# Patient Record
Sex: Female | Born: 1985
Health system: Southern US, Community
[De-identification: ages and names within clinical notes are randomized; demographics above are authoritative.]

## PROBLEM LIST (undated history)

## (undated) DIAGNOSIS — F329 Major depressive disorder, single episode, unspecified: Secondary | ICD-10-CM

## (undated) DIAGNOSIS — F79 Unspecified intellectual disabilities: Secondary | ICD-10-CM

## (undated) DIAGNOSIS — J40 Bronchitis, not specified as acute or chronic: Secondary | ICD-10-CM

## (undated) DIAGNOSIS — F32A Depression, unspecified: Secondary | ICD-10-CM

## (undated) DIAGNOSIS — F419 Anxiety disorder, unspecified: Secondary | ICD-10-CM

---

## 2007-04-09 ENCOUNTER — Ambulatory Visit (HOSPITAL_COMMUNITY): Admission: RE | Admit: 2007-04-09 | Discharge: 2007-04-09 | Payer: Self-pay | Admitting: Obstetrics & Gynecology

## 2007-04-12 ENCOUNTER — Ambulatory Visit (HOSPITAL_COMMUNITY): Admission: RE | Admit: 2007-04-12 | Discharge: 2007-04-12 | Payer: Self-pay | Admitting: Obstetrics & Gynecology

## 2007-05-07 ENCOUNTER — Ambulatory Visit: Payer: Self-pay | Admitting: Pediatrics

## 2007-05-27 ENCOUNTER — Inpatient Hospital Stay (HOSPITAL_COMMUNITY): Admission: AD | Admit: 2007-05-27 | Discharge: 2007-05-27 | Payer: Self-pay | Admitting: Obstetrics

## 2007-08-03 ENCOUNTER — Inpatient Hospital Stay (HOSPITAL_COMMUNITY): Admission: AD | Admit: 2007-08-03 | Discharge: 2007-08-03 | Payer: Self-pay | Admitting: Obstetrics & Gynecology

## 2007-08-06 ENCOUNTER — Inpatient Hospital Stay (HOSPITAL_COMMUNITY): Admission: AD | Admit: 2007-08-06 | Discharge: 2007-08-08 | Payer: Self-pay | Admitting: Obstetrics

## 2010-02-20 ENCOUNTER — Emergency Department (HOSPITAL_BASED_OUTPATIENT_CLINIC_OR_DEPARTMENT_OTHER): Admission: EM | Admit: 2010-02-20 | Discharge: 2010-02-20 | Payer: Self-pay | Admitting: Emergency Medicine

## 2010-02-20 ENCOUNTER — Ambulatory Visit: Payer: Self-pay | Admitting: Diagnostic Radiology

## 2010-09-18 ENCOUNTER — Encounter: Payer: Self-pay | Admitting: Obstetrics & Gynecology
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
## 2011-04-20 ENCOUNTER — Emergency Department (HOSPITAL_BASED_OUTPATIENT_CLINIC_OR_DEPARTMENT_OTHER)
Admission: EM | Admit: 2011-04-20 | Discharge: 2011-04-20 | Disposition: A | Discharge status: Home / Self Care | Payer: Medicaid Other | Attending: Emergency Medicine | Admitting: Emergency Medicine

## 2011-04-20 ENCOUNTER — Encounter: Payer: Self-pay | Admitting: *Deleted

## 2011-04-20 DIAGNOSIS — N63 Unspecified lump in unspecified breast: Secondary | ICD-10-CM | POA: Insufficient documentation

## 2011-04-20 DIAGNOSIS — R079 Chest pain, unspecified: Secondary | ICD-10-CM | POA: Insufficient documentation

## 2011-04-20 DIAGNOSIS — F341 Dysthymic disorder: Secondary | ICD-10-CM | POA: Insufficient documentation

## 2011-04-20 DIAGNOSIS — N644 Mastodynia: Secondary | ICD-10-CM

## 2011-04-20 HISTORY — DX: Unspecified intellectual disabilities: F79

## 2011-04-20 HISTORY — DX: Major depressive disorder, single episode, unspecified: F32.9

## 2011-04-20 HISTORY — DX: Anxiety disorder, unspecified: F41.9

## 2011-04-20 HISTORY — DX: Depression, unspecified: F32.A

## 2011-04-20 MED ORDER — HYDROCODONE-ACETAMINOPHEN 5-325 MG PO TABS
1.0000 | ORAL_TABLET | Freq: Once | ORAL | Status: AC
  Administered 2011-04-20: 1 via ORAL
  Filled 2011-04-20: qty 1

## 2011-04-20 MED ORDER — HYDROCODONE-ACETAMINOPHEN 5-500 MG PO TABS: 1.0000 | ORAL_TABLET | Freq: Four times a day (QID) | ORAL | Status: AC | PRN

## 2011-04-20 NOTE — ED Provider Notes (Signed)
History     CSN: [id number] Arrival date & time: 04/20/2011  1:09 PM  Chief Complaint  Patient presents with  . Chest Pain    left breast   HPI Comments: Pain in left breast for several days and getting worse.  She denies injury or trauma.  There is no redness or discharge.  No fevers or chills.  Worse with palpation.  No alleviating factors.  The history is provided by the patient.    Past Medical History  Diagnosis Date  . Depression   . Anxiety   . Mental retardation     History reviewed. No pertinent past surgical history.  No family history on file.  History  Substance Use Topics  . Smoking status: Former Games developer  . Smokeless tobacco: Not on file  . Alcohol Use: Yes     occassionally    OB History    Grav Para Term Preterm Abortions TAB SAB Ect Mult Living                  Review of Systems  Constitutional: Negative for fever and chills.  HENT: Negative for neck pain and neck stiffness.   Respiratory: Negative for cough, chest tightness and shortness of breath.   Cardiovascular: Negative for chest pain and palpitations.  Skin:       As above   All other systems reviewed and are negative.    Physical Exam  BP 110/62  Pulse 98  Temp(Src) 98 F (36.7 C) (Oral)  Resp 20  Ht 4\' 10"  (1.473 m)  Wt 94 lb (42.638 kg)  BMI 19.65 kg/m2  SpO2 100%  LMP 04/18/2011  Physical Exam  Constitutional: She appears well-developed and well-nourished. No distress.  HENT:  Head: Normocephalic and atraumatic.  Neck: Normal range of motion. Neck supple.  Cardiovascular: Normal rate and regular rhythm.   Pulmonary/Chest: Effort normal and breath sounds normal.  Skin: She is not diaphoretic.       The breast exam was witnessed by the RN on duty.  There is exquisite ttp over the breast @ 4oclock.  There is a small nodule palpable there.  No redness.    ED Course  Procedures  MDM I called [facility] to arrange an ultrasound for tomorrow.  Will give pain meds, imaging  tomorrow.      Geoffery Lyons, MD 04/20/11 1350

## 2011-04-20 NOTE — ED Notes (Signed)
Patient states 3 days ago she developed intermittent left breast pain.  Denies any injury.  Describes as sharp pain.

## 2011-04-21 ENCOUNTER — Other Ambulatory Visit: Payer: Medicaid Other

## 2011-04-24 ENCOUNTER — Ambulatory Visit
Admission: RE | Admit: 2011-04-24 | Discharge: 2011-04-24 | Disposition: A | Discharge status: Home / Self Care | Payer: Medicaid Other | Source: Ambulatory Visit | Attending: Emergency Medicine | Admitting: Emergency Medicine

## 2011-04-24 ENCOUNTER — Other Ambulatory Visit: Payer: Self-pay | Admitting: Emergency Medicine

## 2011-04-24 ENCOUNTER — Ambulatory Visit: Admission: RE | Admit: 2011-04-24 | Payer: Medicaid Other | Source: Ambulatory Visit

## 2011-04-24 ENCOUNTER — Other Ambulatory Visit (HOSPITAL_BASED_OUTPATIENT_CLINIC_OR_DEPARTMENT_OTHER): Payer: Self-pay | Admitting: Emergency Medicine

## 2011-04-24 DIAGNOSIS — N6320 Unspecified lump in the left breast, unspecified quadrant: Secondary | ICD-10-CM

## 2011-04-24 DIAGNOSIS — N63 Unspecified lump in unspecified breast: Secondary | ICD-10-CM

## 2011-06-05 LAB — CBC
HCT: 30.4 — ABNORMAL LOW
Hemoglobin: 10.4 — ABNORMAL LOW
Hemoglobin: 14.4
MCV: 96.4
RBC: 3.16 — ABNORMAL LOW
RBC: 4.37
WBC: 14.5 — ABNORMAL HIGH
WBC: 9.7

## 2011-06-05 LAB — RH IMMUNE GLOB WKUP(>/=20WKS)(NOT WOMEN'S HOSP)

## 2011-06-08 LAB — RH IMMUNE GLOBULIN WORKUP (NOT WOMEN'S HOSP)
ABO/RH(D): A NEG
Antibody Screen: NEGATIVE

## 2011-10-10 ENCOUNTER — Encounter (HOSPITAL_BASED_OUTPATIENT_CLINIC_OR_DEPARTMENT_OTHER): Payer: Self-pay | Admitting: *Deleted

## 2011-10-10 ENCOUNTER — Emergency Department (HOSPITAL_BASED_OUTPATIENT_CLINIC_OR_DEPARTMENT_OTHER)
Admission: EM | Admit: 2011-10-10 | Discharge: 2011-10-10 | Disposition: A | Discharge status: Home / Self Care | Payer: Medicaid Other | Attending: Emergency Medicine | Admitting: Emergency Medicine

## 2011-10-10 DIAGNOSIS — R05 Cough: Secondary | ICD-10-CM | POA: Insufficient documentation

## 2011-10-10 DIAGNOSIS — H9209 Otalgia, unspecified ear: Secondary | ICD-10-CM | POA: Insufficient documentation

## 2011-10-10 DIAGNOSIS — F341 Dysthymic disorder: Secondary | ICD-10-CM | POA: Insufficient documentation

## 2011-10-10 DIAGNOSIS — F79 Unspecified intellectual disabilities: Secondary | ICD-10-CM | POA: Insufficient documentation

## 2011-10-10 DIAGNOSIS — R059 Cough, unspecified: Secondary | ICD-10-CM | POA: Insufficient documentation

## 2011-10-10 DIAGNOSIS — H6690 Otitis media, unspecified, unspecified ear: Secondary | ICD-10-CM | POA: Insufficient documentation

## 2011-10-10 MED ORDER — IBUPROFEN 800 MG PO TABS: 800.0000 mg | ORAL_TABLET | Freq: Three times a day (TID) | ORAL | Status: AC

## 2011-10-10 MED ORDER — AZITHROMYCIN 250 MG PO TABS: 250.0000 mg | ORAL_TABLET | Freq: Every day | ORAL | Status: AC

## 2011-10-10 NOTE — ED Provider Notes (Signed)
Medical screening examination/treatment/procedure(s) were performed by non-physician practitioner and as supervising physician I was immediately available for consultation/collaboration.  Ethelda Chick, MD 10/10/11 Zollie Pee

## 2011-10-10 NOTE — ED Notes (Signed)
Pt c/o URI symptoms x 2 days 

## 2011-10-10 NOTE — ED Provider Notes (Signed)
History     CSN: [id number]  Arrival date & time 10/10/11  1707   First MD Initiated Contact with Patient 10/10/11 1723      Chief Complaint  Patient presents with  . URI    (Consider location/radiation/quality/duration/timing/severity/associated sxs/prior treatment) Patient is a 26 y.o. female presenting with URI and cough. The history is provided by the patient. No language interpreter was used.  URI The primary symptoms include ear pain and cough. The current episode started today. This is a new problem. The problem has been gradually worsening.  Symptoms associated with the illness include chills, plugged ear sensation, facial pain and congestion.  Cough This is a new problem. Associated symptoms include chills, ear congestion and ear pain. She has tried decongestants for the symptoms. The treatment provided no relief. She is not a smoker.    Past Medical History  Diagnosis Date  . Depression   . Anxiety   . Mental retardation     History reviewed. No pertinent past surgical history.  History reviewed. No pertinent family history.  History  Substance Use Topics  . Smoking status: Former Games developer  . Smokeless tobacco: Not on file  . Alcohol Use: Yes     occassionally    OB History    Grav Para Term Preterm Abortions TAB SAB Ect Mult Living                  Review of Systems  Constitutional: Positive for chills.  HENT: Positive for ear pain and congestion.   Respiratory: Positive for cough.   All other systems reviewed and are negative.    Allergies  Sulfa drugs cross reactors  Home Medications   Current Outpatient Rx  Name Route Sig Dispense Refill  . CITALOPRAM HYDROBROMIDE 10 MG PO TABS Oral Take 10 mg by mouth daily.      Marland Kitchen DIPHENHYDRAMINE HCL 25 MG PO TABS Oral Take 25 mg by mouth every 6 (six) hours as needed. For congestion    . IBUPROFEN 200 MG PO TABS Oral Take 400 mg by mouth every 6 (six) hours as needed. For pain    . LORAZEPAM 1 MG PO  TABS Oral Take 1 mg by mouth 2 (two) times daily before a meal.        BP 126/70  Temp(Src) 98.1 F (36.7 C) (Oral)  Resp 16  Ht 5\' 2"  (1.575 m)  Wt 120 lb (54.432 kg)  BMI 21.95 kg/m2  SpO2 100%  LMP 10/10/2011  Physical Exam  Vitals reviewed. Constitutional: She appears well-developed and well-nourished.  HENT:  Head: Normocephalic and atraumatic.  Nose: Nose normal.  Mouth/Throat: Oropharynx is clear and moist.       Red bulging right tm  Eyes: Conjunctivae and EOM are normal. Pupils are equal, round, and reactive to light.  Neck: Normal range of motion. Neck supple.  Cardiovascular: Normal rate.   Pulmonary/Chest: Effort normal.  Abdominal: Soft.  Musculoskeletal: Normal range of motion.  Neurological: She is alert.  Skin: Skin is warm.  Psychiatric: She has a normal mood and affect.    ED Course  Procedures (including critical care time)  Labs Reviewed - No data to display No results found.   No diagnosis found.    MDM  Pt given rx for zithromax and ibuprofen.  I advised see primary Md for recheck in 3-4 day       Langston Masker, [place] 10/10/11 1815

## 2011-10-10 NOTE — Discharge Instructions (Signed)

## 2012-08-18 ENCOUNTER — Encounter (HOSPITAL_BASED_OUTPATIENT_CLINIC_OR_DEPARTMENT_OTHER): Payer: Self-pay | Admitting: *Deleted

## 2012-08-18 ENCOUNTER — Emergency Department (HOSPITAL_BASED_OUTPATIENT_CLINIC_OR_DEPARTMENT_OTHER)
Admission: EM | Admit: 2012-08-18 | Discharge: 2012-08-18 | Disposition: A | Discharge status: Home / Self Care | Payer: Medicaid Other | Attending: Emergency Medicine | Admitting: Emergency Medicine

## 2012-08-18 DIAGNOSIS — F411 Generalized anxiety disorder: Secondary | ICD-10-CM | POA: Insufficient documentation

## 2012-08-18 DIAGNOSIS — Z87891 Personal history of nicotine dependence: Secondary | ICD-10-CM | POA: Insufficient documentation

## 2012-08-18 DIAGNOSIS — F3289 Other specified depressive episodes: Secondary | ICD-10-CM | POA: Insufficient documentation

## 2012-08-18 DIAGNOSIS — N63 Unspecified lump in unspecified breast: Secondary | ICD-10-CM | POA: Insufficient documentation

## 2012-08-18 DIAGNOSIS — F329 Major depressive disorder, single episode, unspecified: Secondary | ICD-10-CM | POA: Insufficient documentation

## 2012-08-18 DIAGNOSIS — Z79899 Other long term (current) drug therapy: Secondary | ICD-10-CM | POA: Insufficient documentation

## 2012-08-18 DIAGNOSIS — F79 Unspecified intellectual disabilities: Secondary | ICD-10-CM | POA: Insufficient documentation

## 2012-08-18 DIAGNOSIS — Z3202 Encounter for pregnancy test, result negative: Secondary | ICD-10-CM | POA: Insufficient documentation

## 2012-08-18 NOTE — ED Provider Notes (Signed)
History  This chart was scribed for Carmen Lyons, MD by Shari Heritage, ED Scribe. The patient was seen in room MH08/MH08. Patient's care was started at 1631.  CSN: [id number]  Arrival date & time 08/18/12  1435   First MD Initiated Contact with Patient 08/18/12 1631      Chief Complaint  Patient presents with  . Abscess     The history is provided by the patient. No language interpreter was used.    HPI Comments: Carmen Lambert is a 26 y.o. female who presents to the Emergency Department complaining of lumps to both breasts that developed 3 days ago. Patient states there is some mild to moderate soreness to both areas. She denies any discharge or drainage from the areas. Patient states that she has had similar bumps in this area 1 year ago. She was evaluated at the [facility] and had an US done that showed fibrocystic breast tissue, but no other significant conditions. She says that bumps eventually went away the first time they occurred. Patient is a former smoker.    Past Medical History  Diagnosis Date  . Depression   . Anxiety   . Mental retardation     History reviewed. No pertinent past surgical history.  History reviewed. No pertinent family history.  History  Substance Use Topics  . Smoking status: Former Games developer  . Smokeless tobacco: Not on file  . Alcohol Use: Yes     Comment: occassionally    OB History    Grav Para Term Preterm Abortions TAB SAB Ect Mult Living                  Review of Systems  Skin:       Positive for lumps to both breasts.  All other systems reviewed and are negative.    Allergies  Sulfa drugs cross reactors  Home Medications   Current Outpatient Rx  Name  Route  Sig  Dispense  Refill  . CITALOPRAM HYDROBROMIDE 10 MG PO TABS   Oral   Take 10 mg by mouth daily.           Marland Kitchen DIPHENHYDRAMINE HCL 25 MG PO TABS   Oral   Take 25 mg by mouth every 6 (six) hours as needed. For congestion         . IBUPROFEN 200 MG PO  TABS   Oral   Take 400 mg by mouth every 6 (six) hours as needed. For pain         . LORAZEPAM 1 MG PO TABS   Oral   Take 1 mg by mouth 2 (two) times daily before a meal.             BP 117/86  Pulse 76  Temp 97.9 F (36.6 C) (Oral)  Resp 20  Ht 5' (1.524 m)  Wt 100 lb (45.36 kg)  BMI 19.53 kg/m2  SpO2 100%  LMP 08/17/2012  Physical Exam  Constitutional: She is oriented to person, place, and time. She appears well-developed and well-nourished. No distress.  HENT:  Head: Normocephalic and atraumatic.  Eyes: Conjunctivae normal and EOM are normal. Pupils are equal, round, and reactive to light.  Neck: Neck supple.  Cardiovascular: Normal rate.   Pulmonary/Chest: Effort normal.       On left breast at 3:00 position there is a small 2 cm fibrocystic lump palpable. No redness or erythema. No nipple discharge. Same is present on the right breast at 9:00.   Abdominal:  She exhibits no distension.  Musculoskeletal: Normal range of motion. She exhibits no edema and no tenderness.  Neurological: She is alert and oriented to person, place, and time.  Skin: Skin is warm and dry. No rash noted.    ED Course  Procedures (including critical care time) DIAGNOSTIC STUDIES: Oxygen Saturation is 100% on room air, normal by my interpretation.    COORDINATION OF CARE: 4:38 PM- Patient informed of current plan for treatment and evaluation and agrees with plan at this time.  Results for orders placed during the hospital encounter of 08/18/12  PREGNANCY, URINE      Component Value Range   Preg Test, Ur NEGATIVE  NEGATIVE    No results found.   No diagnosis found.    MDM  Patient seen one year ago for same, had [place] and was told was fibrocystic change.  I suspect this is what is occurring today.  It does not appear to be an abscess and this is present in both breasts symmetrically, making the odds of this being neoplastic low.  She is to watch this for the next week or two and  follow up if worsening or not improving.      I personally performed the services described in this documentation, which was scribed in my presence. The recorded information has been reviewed and is accurate.    Carmen Lyons, MD 08/18/12 2116

## 2012-08-18 NOTE — ED Notes (Signed)
Lumps to both breasts x 3 days

## 2013-01-30 ENCOUNTER — Emergency Department (HOSPITAL_BASED_OUTPATIENT_CLINIC_OR_DEPARTMENT_OTHER)
Admission: EM | Admit: 2013-01-30 | Discharge: 2013-01-31 | Disposition: A | Discharge status: Home / Self Care | Payer: Medicaid Other | Attending: Emergency Medicine | Admitting: Emergency Medicine

## 2013-01-30 ENCOUNTER — Encounter (HOSPITAL_BASED_OUTPATIENT_CLINIC_OR_DEPARTMENT_OTHER): Payer: Self-pay | Admitting: *Deleted

## 2013-01-30 ENCOUNTER — Emergency Department (HOSPITAL_BASED_OUTPATIENT_CLINIC_OR_DEPARTMENT_OTHER): Payer: Medicaid Other

## 2013-01-30 DIAGNOSIS — Z87891 Personal history of nicotine dependence: Secondary | ICD-10-CM | POA: Insufficient documentation

## 2013-01-30 DIAGNOSIS — Z79899 Other long term (current) drug therapy: Secondary | ICD-10-CM | POA: Insufficient documentation

## 2013-01-30 DIAGNOSIS — Y9289 Other specified places as the place of occurrence of the external cause: Secondary | ICD-10-CM | POA: Insufficient documentation

## 2013-01-30 DIAGNOSIS — W2209XA Striking against other stationary object, initial encounter: Secondary | ICD-10-CM | POA: Insufficient documentation

## 2013-01-30 DIAGNOSIS — S9031XA Contusion of right foot, initial encounter: Secondary | ICD-10-CM

## 2013-01-30 DIAGNOSIS — S90414A Abrasion, right lesser toe(s), initial encounter: Secondary | ICD-10-CM

## 2013-01-30 DIAGNOSIS — F329 Major depressive disorder, single episode, unspecified: Secondary | ICD-10-CM | POA: Insufficient documentation

## 2013-01-30 DIAGNOSIS — F3289 Other specified depressive episodes: Secondary | ICD-10-CM | POA: Insufficient documentation

## 2013-01-30 DIAGNOSIS — Y9389 Activity, other specified: Secondary | ICD-10-CM | POA: Insufficient documentation

## 2013-01-30 DIAGNOSIS — S91311A Laceration without foreign body, right foot, initial encounter: Secondary | ICD-10-CM

## 2013-01-30 DIAGNOSIS — S9030XA Contusion of unspecified foot, initial encounter: Secondary | ICD-10-CM | POA: Insufficient documentation

## 2013-01-30 DIAGNOSIS — S91309A Unspecified open wound, unspecified foot, initial encounter: Secondary | ICD-10-CM | POA: Insufficient documentation

## 2013-01-30 DIAGNOSIS — IMO0002 Reserved for concepts with insufficient information to code with codable children: Secondary | ICD-10-CM | POA: Insufficient documentation

## 2013-01-30 DIAGNOSIS — Z23 Encounter for immunization: Secondary | ICD-10-CM | POA: Insufficient documentation

## 2013-01-30 DIAGNOSIS — F411 Generalized anxiety disorder: Secondary | ICD-10-CM | POA: Insufficient documentation

## 2013-01-30 DIAGNOSIS — F79 Unspecified intellectual disabilities: Secondary | ICD-10-CM | POA: Insufficient documentation

## 2013-01-30 NOTE — ED Notes (Signed)
Right foot injury. Skin tear between her right 2nd and 3rd toes. Bleeding controlled. Bruising to her left 2nd toe.

## 2013-01-31 MED ORDER — TRIPLE ANTIBIOTIC 5-400-5000 EX OINT: TOPICAL_OINTMENT | CUTANEOUS | Status: AC

## 2013-01-31 MED ORDER — TETANUS-DIPHTH-ACELL PERTUSSIS 5-2.5-18.5 LF-MCG/0.5 IM SUSP
0.5000 mL | Freq: Once | INTRAMUSCULAR | Status: AC
  Administered 2013-01-31: 0.5 mL via INTRAMUSCULAR
  Filled 2013-01-31: qty 0.5
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
## 2013-01-31 NOTE — ED Notes (Signed)
Ortho charges placed for splint and crutches were charted on incorrect patient.

## 2013-01-31 NOTE — ED Provider Notes (Signed)
History     CSN: [id number]  Arrival date & time 02/06/2013  2153   First MD Initiated Contact with Patient 01/31/13 0009      Chief Complaint  Patient presents with  . Foot Injury    (Consider location/radiation/quality/duration/timing/severity/associated sxs/prior treatment) HPI This is a 27 year old female who attempted to put her dog with her foot yesterday evening and kicked the doorjamb instead. She has an abrasion to the dorsal right second toe and a laceration of the web between the right second and third toes. There is associated tenderness and ecchymosis. There is moderate pain associated with it. There is no deformity. She denies other injury. She is not aware of when her last tetanus shot was administered. She has cleaned the wound with peroxide and water.  Past Medical History  Diagnosis Date  . Depression   . Anxiety   . Mental retardation     History reviewed. No pertinent past surgical history.  No family history on file.  History  Substance Use Topics  . Smoking status: Former Games developer  . Smokeless tobacco: Not on file  . Alcohol Use: Yes     Comment: occassionally    OB History   Grav Para Term Preterm Abortions TAB SAB Ect Mult Living                  Review of Systems  All other systems reviewed and are negative.    Allergies  Sulfa drugs cross reactors  Home Medications   Current Outpatient Rx  Name  Route  Sig  Dispense  Refill  . citalopram (CELEXA) 10 MG tablet   Oral   Take 10 mg by mouth daily.           . diphenhydrAMINE (BENADRYL) 25 MG tablet   Oral   Take 25 mg by mouth every 6 (six) hours as needed. For congestion         . ibuprofen (ADVIL,MOTRIN) 200 MG tablet   Oral   Take 400 mg by mouth every 6 (six) hours as needed. For pain         . LORazepam (ATIVAN) 1 MG tablet   Oral   Take 1 mg by mouth 2 (two) times daily before a meal.             BP 112/66  Pulse 66  Temp(Src) 97.9 F (36.6 C) (Oral)  Resp 20   Wt 100 lb (45.36 kg)  BMI 19.53 kg/m2  SpO2 100%  Physical Exam General: Well-developed, well-nourished female in no acute distress; appearance consistent with age of record HENT: normocephalic, atraumatic Eyes: pupils equal round and reactive to light; extraocular muscles intact Neck: supple Heart: regular rate and rhythm Lungs: clear to auscultation bilaterally Abdomen: soft; nondistended; nontender Extremities: No deformity; superficial abrasion of the dorsal right second toe; laceration of web between right second and third toes with ecchymosis proximal to the bases of the right second and third toes, toes distally neurovascularly intact Neurologic: Awake, alert and oriented; motor function intact in all extremities and symmetric; no facial droop Skin: Warm and dry Psychiatric: Normal mood and affect    ED Course  Procedures (including critical care time)    MDM  Nursing notes and vitals signs, including pulse oximetry, reviewed.  Summary of this visit's results, reviewed by myself:   Imaging Studies: Dg Foot Complete Right  02-06-13   *RADIOLOGY REPORT*  Clinical Data: Right foot injury, with skin tear between the right second and third  toes.  RIGHT FOOT COMPLETE - 3+ VIEW  Comparison: None.  Findings: There is no evidence of fracture or dislocation.  The joint spaces are preserved.  There is no evidence of talar subluxation; the subtalar joint is unremarkable in appearance. Bipartite medial and lateral sesamoids of the first toe are incidentally seen.  The known soft tissue laceration is not well characterized on radiograph.  No radiopaque foreign bodies are seen.  IMPRESSION:  1.  No evidence of fracture or dislocation. 2.  Bipartite medial and lateral sesamoids of the first toe incidentally noted.   Original Report Authenticated By: Tonia Ghent, M.D.   12:17 AM Suturing of the wound in the 2 toes is not indicated. The wound edges are well opposed to natural anatomical  alignment of the toes. Suture closure would likely cause more discomfort and would not significantly improve wound closure and carries the risk of wound infection.         Hanley Seamen, MD 01/31/13 [phone]

## 2013-10-21 ENCOUNTER — Emergency Department (HOSPITAL_BASED_OUTPATIENT_CLINIC_OR_DEPARTMENT_OTHER)
Admission: EM | Admit: 2013-10-21 | Discharge: 2013-10-21 | Disposition: A | Discharge status: Home / Self Care | Payer: Medicaid Other | Attending: Emergency Medicine | Admitting: Emergency Medicine

## 2013-10-21 ENCOUNTER — Encounter (HOSPITAL_BASED_OUTPATIENT_CLINIC_OR_DEPARTMENT_OTHER): Payer: Self-pay | Admitting: Emergency Medicine

## 2013-10-21 ENCOUNTER — Emergency Department (HOSPITAL_BASED_OUTPATIENT_CLINIC_OR_DEPARTMENT_OTHER): Payer: Medicaid Other

## 2013-10-21 DIAGNOSIS — F329 Major depressive disorder, single episode, unspecified: Secondary | ICD-10-CM | POA: Insufficient documentation

## 2013-10-21 DIAGNOSIS — F3289 Other specified depressive episodes: Secondary | ICD-10-CM | POA: Insufficient documentation

## 2013-10-21 DIAGNOSIS — F411 Generalized anxiety disorder: Secondary | ICD-10-CM | POA: Insufficient documentation

## 2013-10-21 DIAGNOSIS — Z79899 Other long term (current) drug therapy: Secondary | ICD-10-CM | POA: Insufficient documentation

## 2013-10-21 DIAGNOSIS — J209 Acute bronchitis, unspecified: Secondary | ICD-10-CM | POA: Insufficient documentation

## 2013-10-21 DIAGNOSIS — F17200 Nicotine dependence, unspecified, uncomplicated: Secondary | ICD-10-CM | POA: Insufficient documentation

## 2013-10-21 MED ORDER — IPRATROPIUM-ALBUTEROL 0.5-2.5 (3) MG/3ML IN SOLN
RESPIRATORY_TRACT | Status: AC
  Filled 2013-10-21: qty 3

## 2013-10-21 MED ORDER — PREDNISONE 50 MG PO TABS
60.0000 mg | ORAL_TABLET | Freq: Once | ORAL | Status: AC
  Administered 2013-10-21: 60 mg via ORAL
  Filled 2013-10-21 (×2): qty 1

## 2013-10-21 MED ORDER — PREDNISONE 50 MG PO TABS
50.0000 mg | ORAL_TABLET | Freq: Every day | ORAL | Status: DC
Start: 2013-10-21 — End: 2014-07-12

## 2013-10-21 MED ORDER — ALBUTEROL SULFATE HFA 108 (90 BASE) MCG/ACT IN AERS
1.0000 | INHALATION_SPRAY | RESPIRATORY_TRACT | Status: DC | PRN
  Administered 2013-10-21: 2 via RESPIRATORY_TRACT
  Filled 2013-10-21: qty 6.7

## 2013-10-21 MED ORDER — IPRATROPIUM-ALBUTEROL 0.5-2.5 (3) MG/3ML IN SOLN
3.0000 mL | RESPIRATORY_TRACT | Status: DC
  Administered 2013-10-21: 3 mL via RESPIRATORY_TRACT

## 2013-10-21 NOTE — ED Notes (Signed)
Pt hx reads MR-pt agreed-pt A/O-answering all ?s appropriately-shows no signs of developmental delay-fiance is with pt

## 2013-10-21 NOTE — ED Provider Notes (Signed)
CSN: [id number]     Arrival date & time 10/21/13  1624 History   First MD Initiated Contact with Patient 10/21/13 1631     Chief Complaint  Patient presents with  . Cough   Patient is a 28 y.o. female presenting with cough. The history is provided by the patient.  Cough Cough characteristics:  Non-productive Onset quality:  Gradual Duration:  3 days Timing:  Constant Context: upper respiratory infection and weather changes   Relieved by:  Nothing Associated symptoms: shortness of breath and wheezing   Associated symptoms: no chest pain and no fever     Past Medical History  Diagnosis Date  . Depression   . Anxiety   . Mental retardation    History reviewed. No pertinent past surgical history. No family history on file. History  Substance Use Topics  . Smoking status: Current Every Day Smoker  . Smokeless tobacco: Not on file  . Alcohol Use: No   OB History   Grav Para Term Preterm Abortions TAB SAB Ect Mult Living                 Review of Systems  Constitutional: Negative for fever.  Respiratory: Positive for cough, shortness of breath and wheezing.   Cardiovascular: Negative for chest pain.  All other systems reviewed and are negative.      Allergies  Sulfa drugs cross reactors  Home Medications   Current Outpatient Rx  Name  Route  Sig  Dispense  Refill  . citalopram (CELEXA) 10 MG tablet   Oral   Take 10 mg by mouth daily.           . diphenhydrAMINE (BENADRYL) 25 MG tablet   Oral   Take 25 mg by mouth every 6 (six) hours as needed. For congestion         . ibuprofen (ADVIL,MOTRIN) 200 MG tablet   Oral   Take 400 mg by mouth every 6 (six) hours as needed. For pain         . LORazepam (ATIVAN) 1 MG tablet   Oral   Take 1 mg by mouth 2 (two) times daily before a meal.           . neomycin-bacitracin-polymyxin (NEOSPORIN) [phone] ointment      Apply to wounds twice daily and bandage.         . predniSONE (DELTASONE) 50 MG  tablet   Oral   Take 1 tablet (50 mg total) by mouth daily.   5 tablet   0     Dispense as written.    BP 126/80  Pulse 94  Temp(Src) 98.2 F (36.8 C) (Oral)  Resp 20  Ht 5\' 2"  (1.575 m)  Wt 98 lb (44.453 kg)  BMI 17.92 kg/m2  SpO2 95%  LMP 10/07/2013 Physical Exam  Nursing note and vitals reviewed. Constitutional: She appears well-developed and well-nourished. No distress.  HENT:  Head: Normocephalic and atraumatic.  Right Ear: External ear normal.  Left Ear: External ear normal.  Eyes: Conjunctivae are normal. Right eye exhibits no discharge. Left eye exhibits no discharge. No scleral icterus.  Neck: Neck supple. No tracheal deviation present.  Cardiovascular: Normal rate, regular rhythm and intact distal pulses.   Pulmonary/Chest: Effort normal. No stridor. No respiratory distress. She has wheezes. She has no rales.  Abdominal: Soft. Bowel sounds are normal. She exhibits no distension. There is no tenderness. There is no rebound and no guarding.  Musculoskeletal: She exhibits no edema and  no tenderness.  Neurological: She is alert. She has normal strength. No cranial nerve deficit (no facial droop, extraocular movements intact, no slurred speech) or sensory deficit. She exhibits normal muscle tone. She displays no seizure activity. Coordination normal.  Skin: Skin is warm and dry. No rash noted.  Psychiatric: She has a normal mood and affect.    ED Course  Procedures (including critical care time) Labs Review Labs Reviewed - No data to display Imaging Review Dg Chest 2 View  10/21/2013   CLINICAL DATA:  Wheezing, productive cough  EXAM: CHEST  2 VIEW  COMPARISON:  None.  FINDINGS: Cardiomediastinal silhouette is unremarkable. No acute infiltrate or pleural effusion. No pulmonary edema. Bony thorax is unremarkable.  IMPRESSION: No active cardiopulmonary disease.   Electronically Signed   By: Natasha Mead M.D.   On: 10/21/2013 16:54   Medications  ipratropium-albuterol  (DUONEB) 0.5-2.5 (3) MG/3ML nebulizer solution 3 mL ( Nebulization Not Given 10/21/13 1652)  albuterol (PROVENTIL HFA;VENTOLIN HFA) 108 (90 BASE) MCG/ACT inhaler 1-2 puff (not administered)  predniSONE (DELTASONE) tablet 60 mg (not administered)   1735  No wheezing noted on exam after treatment.  Pt is feeling much better   MDM   Final diagnoses:  Acute bronchitis with bronchospasm    Dc home on steroids.  Albuterol hfa given in the ED.  Discussed dc "vaping"/smoking    Celene Kras, MD 10/21/13 [phone]

## 2013-10-21 NOTE — ED Notes (Signed)
C/o prod cough, wheezing x 3 days 

## 2013-10-21 NOTE — Discharge Instructions (Signed)
Acute Bronchitis Bronchitis is inflammation of the airways that extend from the windpipe into the lungs (bronchi). The inflammation often causes mucus to develop. This leads to a cough, which is the most common symptom of bronchitis.  In acute bronchitis, the condition usually develops suddenly and goes away over time, usually in a couple weeks. Smoking, allergies, and asthma can make bronchitis worse. Repeated episodes of bronchitis may cause further lung problems.  CAUSES Acute bronchitis is most often caused by the same virus that causes a cold. The virus can spread from person to person (contagious).  SIGNS AND SYMPTOMS   Cough.   Fever.   Coughing up mucus.   Body aches.   Chest congestion.   Chills.   Shortness of breath.   Sore throat.  DIAGNOSIS  Acute bronchitis is usually diagnosed through a physical exam. Tests, such as chest X-rays, are sometimes done to rule out other conditions.  TREATMENT  Acute bronchitis usually goes away in a couple weeks. Often times, no medical treatment is necessary. Medicines are sometimes given for relief of fever or cough. Antibiotics are usually not needed but may be prescribed in certain situations. In some cases, an inhaler may be recommended to help reduce shortness of breath and control the cough. A cool mist vaporizer may also be used to help thin bronchial secretions and make it easier to clear the chest.  HOME CARE INSTRUCTIONS  Get plenty of rest.   Drink enough fluids to keep your urine clear or pale yellow (unless you have a medical condition that requires fluid restriction). Increasing fluids may help thin your secretions and will prevent dehydration.   Only take over-the-counter or prescription medicines as directed by your health care provider.   Avoid smoking and secondhand smoke. Exposure to cigarette smoke or irritating chemicals will make bronchitis worse. If you are a smoker, consider using nicotine gum or skin  patches to help control withdrawal symptoms. Quitting smoking will help your lungs heal faster.   Reduce the chances of another bout of acute bronchitis by washing your hands frequently, avoiding people with cold symptoms, and trying not to touch your hands to your mouth, nose, or eyes.   Follow up with your health care provider as directed.  SEEK MEDICAL CARE IF: Your symptoms do not improve after 1 week of treatment.  SEEK IMMEDIATE MEDICAL CARE IF:  You develop an increased fever or chills.   You have chest pain.   You have severe shortness of breath.  You have bloody sputum.   You develop dehydration.  You develop fainting.  You develop repeated vomiting.  You develop a severe headache. MAKE SURE YOU:   Understand these instructions.  Will watch your condition.  Will get help right away if you are not doing well or get worse. Document Released: 09/21/2004 Document Revised: 04/16/2013 Document Reviewed: 02/04/2013 [employer] Patient Information 2014 [employer].  

## 2014-01-05 ENCOUNTER — Emergency Department (HOSPITAL_BASED_OUTPATIENT_CLINIC_OR_DEPARTMENT_OTHER)
Admission: EM | Admit: 2014-01-05 | Discharge: 2014-01-05 | Disposition: A | Discharge status: Home / Self Care | Payer: Medicaid Other | Attending: Emergency Medicine | Admitting: Emergency Medicine

## 2014-01-05 ENCOUNTER — Encounter (HOSPITAL_BASED_OUTPATIENT_CLINIC_OR_DEPARTMENT_OTHER): Payer: Self-pay | Admitting: Emergency Medicine

## 2014-01-05 DIAGNOSIS — Z792 Long term (current) use of antibiotics: Secondary | ICD-10-CM | POA: Insufficient documentation

## 2014-01-05 DIAGNOSIS — J45901 Unspecified asthma with (acute) exacerbation: Secondary | ICD-10-CM | POA: Insufficient documentation

## 2014-01-05 DIAGNOSIS — J209 Acute bronchitis, unspecified: Secondary | ICD-10-CM

## 2014-01-05 DIAGNOSIS — F329 Major depressive disorder, single episode, unspecified: Secondary | ICD-10-CM | POA: Insufficient documentation

## 2014-01-05 DIAGNOSIS — F411 Generalized anxiety disorder: Secondary | ICD-10-CM | POA: Insufficient documentation

## 2014-01-05 DIAGNOSIS — IMO0002 Reserved for concepts with insufficient information to code with codable children: Secondary | ICD-10-CM | POA: Insufficient documentation

## 2014-01-05 DIAGNOSIS — F17200 Nicotine dependence, unspecified, uncomplicated: Secondary | ICD-10-CM | POA: Insufficient documentation

## 2014-01-05 DIAGNOSIS — F3289 Other specified depressive episodes: Secondary | ICD-10-CM | POA: Insufficient documentation

## 2014-01-05 DIAGNOSIS — Z8669 Personal history of other diseases of the nervous system and sense organs: Secondary | ICD-10-CM | POA: Insufficient documentation

## 2014-01-05 MED ORDER — AEROCHAMBER PLUS W/MASK MISC
1.0000 | Freq: Once | Status: AC
  Administered 2014-01-05: 1
  Filled 2014-01-05: qty 1

## 2014-01-05 MED ORDER — ALBUTEROL SULFATE (2.5 MG/3ML) 0.083% IN NEBU
5.0000 mg | INHALATION_SOLUTION | Freq: Once | RESPIRATORY_TRACT | Status: AC
  Administered 2014-01-05: 5 mg via RESPIRATORY_TRACT
  Filled 2014-01-05: qty 6

## 2014-01-05 MED ORDER — NAPROXEN 250 MG PO TABS
ORAL_TABLET | ORAL | Status: AC
  Administered 2014-01-05: 500 mg via ORAL
  Filled 2014-01-05: qty 2

## 2014-01-05 MED ORDER — DEXAMETHASONE SODIUM PHOSPHATE 10 MG/ML IJ SOLN
INTRAMUSCULAR | Status: AC
  Administered 2014-01-05: 10 mg via ORAL
  Filled 2014-01-05: qty 1

## 2014-01-05 MED ORDER — IPRATROPIUM BROMIDE 0.02 % IN SOLN
0.5000 mg | Freq: Once | RESPIRATORY_TRACT | Status: AC
  Administered 2014-01-05: 0.5 mg via RESPIRATORY_TRACT

## 2014-01-05 MED ORDER — ALBUTEROL SULFATE HFA 108 (90 BASE) MCG/ACT IN AERS: 2.0000 | INHALATION_SPRAY | RESPIRATORY_TRACT | Status: DC | PRN

## 2014-01-05 MED ORDER — NAPROXEN 250 MG PO TABS
500.0000 mg | ORAL_TABLET | Freq: Once | ORAL | Status: AC
  Administered 2014-01-05: 500 mg via ORAL

## 2014-01-05 MED ORDER — ALBUTEROL SULFATE HFA 108 (90 BASE) MCG/ACT IN AERS
2.0000 | INHALATION_SPRAY | RESPIRATORY_TRACT | Status: DC | PRN
  Administered 2014-01-05: 2 via RESPIRATORY_TRACT
  Filled 2014-01-05: qty 6.7

## 2014-01-05 MED ORDER — IPRATROPIUM BROMIDE 0.02 % IN SOLN
RESPIRATORY_TRACT | Status: AC
  Administered 2014-01-05: 0.5 mg via RESPIRATORY_TRACT
  Filled 2014-01-05: qty 2.5

## 2014-01-05 MED ORDER — DEXAMETHASONE 10 MG/ML FOR PEDIATRIC ORAL USE
10.0000 mg | Freq: Once | INTRAMUSCULAR | Status: AC
  Administered 2014-01-05: 10 mg via ORAL
  Filled 2014-01-05: qty 1

## 2014-01-05 MED ORDER — ALBUTEROL SULFATE (2.5 MG/3ML) 0.083% IN NEBU
5.0000 mg | INHALATION_SOLUTION | Freq: Once | RESPIRATORY_TRACT | Status: AC
  Administered 2014-01-05: 5 mg via RESPIRATORY_TRACT

## 2014-01-05 MED ORDER — ALBUTEROL SULFATE (2.5 MG/3ML) 0.083% IN NEBU: 2.5000 mg | INHALATION_SOLUTION | Freq: Once | RESPIRATORY_TRACT | Status: DC

## 2014-01-05 MED ORDER — ALBUTEROL SULFATE (2.5 MG/3ML) 0.083% IN NEBU
INHALATION_SOLUTION | RESPIRATORY_TRACT | Status: AC
  Administered 2014-01-05: 5 mg via RESPIRATORY_TRACT
  Filled 2014-01-05: qty 6

## 2014-01-05 NOTE — ED Notes (Signed)
Pt has been short of breath since 01/04/14 that worsen and pt became extremely SOB audible wheezes note on arrival and neuoneb tx given

## 2014-01-05 NOTE — ED Provider Notes (Signed)
CSN: [id number]     Arrival date & time 01/05/14  0241 History   First MD Initiated Contact with Patient 01/05/14 0253     Chief Complaint  Patient presents with  . Shortness of Breath     (Consider location/radiation/quality/duration/timing/severity/associated sxs/prior Treatment) HPI This is a 28 year old female with a history of bronchitis in the past. She is here with cough, shortness of breath and wheezing that began yesterday. It began mildly and has worsened overnight. It is now moderate to severe. Her cough has been severe enough to cause posttussive emesis. She does not know if she's had a fever. She is having chest wall soreness as a result of coughing. She denies nausea or vomiting. She tried using her albuterol inhaler but it is empty. And albuterol and Atrovent neb treatment was initiated by respiratory therapy per protocol on arrival.  Past Medical History  Diagnosis Date  . Depression   . Anxiety   . Mental retardation    History reviewed. No pertinent past surgical history. History reviewed. No pertinent family history. History  Substance Use Topics  . Smoking status: Current Every Day Smoker  . Smokeless tobacco: Never Used  . Alcohol Use: No   OB History   Grav Para Term Preterm Abortions TAB SAB Ect Mult Living                 Review of Systems  All other systems reviewed and are negative.  Allergies  Sulfa drugs cross reactors  Home Medications   Prior to Admission medications   Medication Sig Start Date End Date Taking? Authorizing Provider  citalopram (CELEXA) 10 MG tablet Take 10 mg by mouth daily.      Historical Provider, MD  diphenhydrAMINE (BENADRYL) 25 MG tablet Take 25 mg by mouth every 6 (six) hours as needed. For congestion    Historical Provider, MD  ibuprofen (ADVIL,MOTRIN) 200 MG tablet Take 400 mg by mouth every 6 (six) hours as needed. For pain    Historical Provider, MD  LORazepam (ATIVAN) 1 MG tablet Take 1 mg by mouth 2 (two) times  daily before a meal.      Historical Provider, MD  neomycin-bacitracin-polymyxin (NEOSPORIN) [phone] ointment Apply to wounds twice daily and bandage. 01/31/13   Chantalle Defilippo L Kenyatta Gloeckner, MD  predniSONE (DELTASONE) 50 MG tablet Take 1 tablet (50 mg total) by mouth daily. 10/21/13   Celene KrasJon R Knapp, MD   BP 107/80  Pulse 98  Resp 24  SpO2 94%  Physical Exam General: Well-developed, well-nourished female in no acute distress; appearance consistent with age of record HENT: normocephalic; atraumatic Eyes: pupils equal, round and reactive to light; extraocular muscles intact Neck: supple Heart: regular rate and rhythm Lungs: Decreased air movement bilaterally with respiratory and expiratory wheezes; frequent cough; tachypnea Abdomen: soft; nondistended; nontender; bowel sounds present Extremities: No deformity; full range of motion; pulses normal Neurologic: Awake, alert; motor function intact in all extremities and symmetric; no facial droop Skin: Warm and dry Psychiatric: Anxious    ED Course  Procedures (including critical care time)   MDM  4:03 AM Air movement is improved, tachypnea has improved the patient feels significantly better after 2 neb treatments. Her oxygen saturation is 98% on room air.    Hanley SeamenJohn L Collie Kittel, MD 01/05/14 [phone]

## 2014-03-02 ENCOUNTER — Emergency Department (HOSPITAL_BASED_OUTPATIENT_CLINIC_OR_DEPARTMENT_OTHER): Payer: Medicaid Other

## 2014-03-02 ENCOUNTER — Encounter (HOSPITAL_BASED_OUTPATIENT_CLINIC_OR_DEPARTMENT_OTHER): Payer: Self-pay | Admitting: Emergency Medicine

## 2014-03-02 ENCOUNTER — Emergency Department (HOSPITAL_BASED_OUTPATIENT_CLINIC_OR_DEPARTMENT_OTHER)
Admission: EM | Admit: 2014-03-02 | Discharge: 2014-03-02 | Disposition: A | Discharge status: Home / Self Care | Payer: Medicaid Other | Attending: Emergency Medicine | Admitting: Emergency Medicine

## 2014-03-02 DIAGNOSIS — Z79899 Other long term (current) drug therapy: Secondary | ICD-10-CM | POA: Insufficient documentation

## 2014-03-02 DIAGNOSIS — Z792 Long term (current) use of antibiotics: Secondary | ICD-10-CM | POA: Insufficient documentation

## 2014-03-02 DIAGNOSIS — F17200 Nicotine dependence, unspecified, uncomplicated: Secondary | ICD-10-CM | POA: Diagnosis not present

## 2014-03-02 DIAGNOSIS — J189 Pneumonia, unspecified organism: Secondary | ICD-10-CM

## 2014-03-02 DIAGNOSIS — F411 Generalized anxiety disorder: Secondary | ICD-10-CM | POA: Insufficient documentation

## 2014-03-02 DIAGNOSIS — J159 Unspecified bacterial pneumonia: Secondary | ICD-10-CM | POA: Insufficient documentation

## 2014-03-02 DIAGNOSIS — F329 Major depressive disorder, single episode, unspecified: Secondary | ICD-10-CM | POA: Diagnosis not present

## 2014-03-02 DIAGNOSIS — F3289 Other specified depressive episodes: Secondary | ICD-10-CM | POA: Insufficient documentation

## 2014-03-02 DIAGNOSIS — R0602 Shortness of breath: Secondary | ICD-10-CM | POA: Diagnosis present

## 2014-03-02 DIAGNOSIS — IMO0002 Reserved for concepts with insufficient information to code with codable children: Secondary | ICD-10-CM | POA: Insufficient documentation

## 2014-03-02 HISTORY — DX: Bronchitis, not specified as acute or chronic: J40

## 2014-03-02 MED ORDER — AZITHROMYCIN 250 MG PO TABS: 250.0000 mg | ORAL_TABLET | Freq: Every day | ORAL | Status: DC

## 2014-03-02 MED ORDER — AZITHROMYCIN 250 MG PO TABS
500.0000 mg | ORAL_TABLET | Freq: Once | ORAL | Status: AC
  Administered 2014-03-02: 500 mg via ORAL
  Filled 2014-03-02: qty 2

## 2014-03-02 MED ORDER — ALBUTEROL SULFATE (2.5 MG/3ML) 0.083% IN NEBU
5.0000 mg | INHALATION_SOLUTION | Freq: Once | RESPIRATORY_TRACT | Status: AC
  Administered 2014-03-02: 5 mg via RESPIRATORY_TRACT
  Filled 2014-03-02: qty 6

## 2014-03-02 MED ORDER — ALBUTEROL SULFATE HFA 108 (90 BASE) MCG/ACT IN AERS: 1.0000 | INHALATION_SPRAY | Freq: Four times a day (QID) | RESPIRATORY_TRACT | Status: DC | PRN

## 2014-03-02 NOTE — Discharge Instructions (Signed)
Follow up as needed for shortness of breath or worsening symptoms Pneumonia, Adult Pneumonia is an infection of the lungs. It may be caused by a germ (virus or bacteria). Some types of pneumonia can spread easily from person to person. This can happen when you cough or sneeze. HOME CARE  Only take medicine as told by your doctor.  Take your medicine (antibiotics) as told. Finish it even if you start to feel better.  Do not smoke.  You may use a vaporizer or humidifier in your room. This can help loosen thick spit (mucus).  Sleep so you are almost sitting up (semi-upright). This helps reduce coughing.  Rest. A shot (vaccine) can help prevent pneumonia. Shots are often advised for:  People over 27 years old.  Patients on chemotherapy.  People with long-term (chronic) lung problems.  People with immune system problems. GET HELP RIGHT AWAY IF:   You are getting worse.  You cannot control your cough, and you are losing sleep.  You cough up blood.  Your pain gets worse, even with medicine.  You have a fever.  Any of your problems are getting worse, not better.  You have shortness of breath or chest pain. MAKE SURE YOU:   Understand these instructions.  Will watch your condition.  Will get help right away if you are not doing well or get worse. Document Released: 01/31/2008 Document Revised: 11/06/2011 Document Reviewed: 11/04/2010 [facility] Patient Information 2015 [place], [place]. This information is not intended to replace advice given to you by your health care provider. Make sure you discuss any questions you have with your health care provider.

## 2014-03-02 NOTE — ED Notes (Signed)
Sob. Wheezing. Ran out of her inhaler a couple of weeks ago. Hx of repeated visits for bronchitis.

## 2014-03-02 NOTE — ED Provider Notes (Signed)
Medical screening examination/treatment/procedure(s) were performed by non-physician practitioner and as supervising physician I was immediately available for consultation/collaboration.     Geoffery Lyonsouglas Avilene Marrin, MD 03/02/14 [phone]

## 2014-03-02 NOTE — ED Provider Notes (Signed)
CSN: [id number]     Arrival date & time 03/02/14  1123 History   First MD Initiated Contact with Patient 03/02/14 1124     Chief Complaint  Patient presents with  . Shortness of Breath     (Consider location/radiation/quality/duration/timing/severity/associated sxs/prior Treatment) HPI Comments: Pt states that she ran out of her inhaler a couple of weeks ago.   Patient is a 28 y.o. female presenting with shortness of breath. The history is provided by the patient. No language interpreter was used.  Shortness of Breath Severity:  Mild Onset quality:  Sudden Duration:  1 day Timing:  Constant Progression:  Worsening Relieved by:  Nothing Worsened by:  Nothing tried Ineffective treatments:  None tried Associated symptoms: no cough, no fever and no sore throat   Risk factors: no tobacco use     Past Medical History  Diagnosis Date  . Depression   . Anxiety   . Mental retardation   . Bronchitis    History reviewed. No pertinent past surgical history. No family history on file. History  Substance Use Topics  . Smoking status: Current Every Day Smoker  . Smokeless tobacco: Never Used  . Alcohol Use: No   OB History   Grav Para Term Preterm Abortions TAB SAB Ect Mult Living                 Review of Systems  Constitutional: Negative for fever.  HENT: Negative for sore throat.   Respiratory: Positive for shortness of breath. Negative for cough.   Cardiovascular: Negative.       Allergies  Sulfa drugs cross reactors  Home Medications   Prior to Admission medications   Medication Sig Start Date End Date Taking? Authorizing Provider  albuterol (PROVENTIL HFA;VENTOLIN HFA) 108 (90 BASE) MCG/ACT inhaler Inhale 2 puffs into the lungs every 4 (four) hours as needed for wheezing or shortness of breath. 01/05/14   Carlisle BeersJohn L Molpus, MD  albuterol (PROVENTIL HFA;VENTOLIN HFA) 108 (90 BASE) MCG/ACT inhaler Inhale 1-2 puffs into the lungs every 6 (six) hours as needed for wheezing  or shortness of breath. 03/02/14   Teressa LowerVrinda Christain Mcraney, NP  azithromycin (ZITHROMAX) 250 MG tablet Take 1 tablet (250 mg total) by mouth daily. Take 1 tablet po daily 03/02/14   Teressa LowerVrinda Shmiel Morton, NP  citalopram (CELEXA) 10 MG tablet Take 10 mg by mouth daily.      Historical Provider, MD  diphenhydrAMINE (BENADRYL) 25 MG tablet Take 25 mg by mouth every 6 (six) hours as needed. For congestion    Historical Provider, MD  ibuprofen (ADVIL,MOTRIN) 200 MG tablet Take 400 mg by mouth every 6 (six) hours as needed. For pain    Historical Provider, MD  LORazepam (ATIVAN) 1 MG tablet Take 1 mg by mouth 2 (two) times daily before a meal.      Historical Provider, MD  neomycin-bacitracin-polymyxin (NEOSPORIN) [phone] ointment Apply to wounds twice daily and bandage. 01/31/13   John L Molpus, MD  predniSONE (DELTASONE) 50 MG tablet Take 1 tablet (50 mg total) by mouth daily. 10/21/13   Linwood DibblesJon Knapp, MD   BP 107/61  Pulse 96  Temp(Src) 97.8 F (36.6 C) (Oral)  Resp 18  Ht 5\' 2"  (1.575 m)  Wt 98 lb (44.453 kg)  BMI 17.92 kg/m2  SpO2 98%  LMP 02/23/2014 Physical Exam  Nursing note and vitals reviewed. Constitutional: She appears well-developed and well-nourished.  Cardiovascular: Normal rate and regular rhythm.   Pulmonary/Chest: She has wheezes.  Musculoskeletal: Normal range  of motion.  Neurological: She is alert.    ED Course  Procedures (including critical care time) Labs Review Labs Reviewed - No data to display  Imaging Review Dg Chest 2 View  03/02/2014   CLINICAL DATA:  Cough, wheezing and shortness of Breath.  EXAM: CHEST  2 VIEW  COMPARISON:  10/21/2013.  FINDINGS: The cardiac silhouette, mediastinal and hilar contours are within normal limits and stable. Ill-defined right middle lobe density is suspicious for developing pneumonia. No edema or effusions. The bony thorax is intact.  IMPRESSION: Right middle lobe infiltrate.   Electronically Signed   By: Loralie ChampagneMark  Gallerani M.D.   On: 03/02/2014 12:29      EKG Interpretation None      MDM   Final diagnoses:  Community acquired pneumonia    Pt no longer wheezing. Not in any distress. Pt vitals are stable . Will send home with inhaler and antibiotic    Teressa LowerVrinda Doylene Splinter, NP 03/02/14 1243

## 2014-04-23 ENCOUNTER — Encounter (HOSPITAL_BASED_OUTPATIENT_CLINIC_OR_DEPARTMENT_OTHER): Payer: Self-pay | Admitting: Emergency Medicine

## 2014-04-23 ENCOUNTER — Emergency Department (HOSPITAL_BASED_OUTPATIENT_CLINIC_OR_DEPARTMENT_OTHER)
Admission: EM | Admit: 2014-04-23 | Discharge: 2014-04-23 | Disposition: A | Discharge status: Home / Self Care | Payer: Medicaid Other | Attending: Emergency Medicine | Admitting: Emergency Medicine

## 2014-04-23 DIAGNOSIS — Z8709 Personal history of other diseases of the respiratory system: Secondary | ICD-10-CM | POA: Insufficient documentation

## 2014-04-23 DIAGNOSIS — IMO0002 Reserved for concepts with insufficient information to code with codable children: Secondary | ICD-10-CM | POA: Insufficient documentation

## 2014-04-23 DIAGNOSIS — Z3202 Encounter for pregnancy test, result negative: Secondary | ICD-10-CM | POA: Diagnosis not present

## 2014-04-23 DIAGNOSIS — K0381 Cracked tooth: Secondary | ICD-10-CM | POA: Insufficient documentation

## 2014-04-23 DIAGNOSIS — K089 Disorder of teeth and supporting structures, unspecified: Secondary | ICD-10-CM | POA: Diagnosis not present

## 2014-04-23 DIAGNOSIS — Z792 Long term (current) use of antibiotics: Secondary | ICD-10-CM | POA: Insufficient documentation

## 2014-04-23 DIAGNOSIS — Z79899 Other long term (current) drug therapy: Secondary | ICD-10-CM | POA: Insufficient documentation

## 2014-04-23 DIAGNOSIS — F17200 Nicotine dependence, unspecified, uncomplicated: Secondary | ICD-10-CM | POA: Insufficient documentation

## 2014-04-23 DIAGNOSIS — K0889 Other specified disorders of teeth and supporting structures: Secondary | ICD-10-CM

## 2014-04-23 DIAGNOSIS — F3289 Other specified depressive episodes: Secondary | ICD-10-CM | POA: Insufficient documentation

## 2014-04-23 DIAGNOSIS — F329 Major depressive disorder, single episode, unspecified: Secondary | ICD-10-CM | POA: Insufficient documentation

## 2014-04-23 DIAGNOSIS — F411 Generalized anxiety disorder: Secondary | ICD-10-CM | POA: Insufficient documentation

## 2014-04-23 MED ORDER — PENICILLIN V POTASSIUM 500 MG PO TABS: 500.0000 mg | ORAL_TABLET | Freq: Four times a day (QID) | ORAL | Status: AC

## 2014-04-23 MED ORDER — OXYCODONE-ACETAMINOPHEN 5-325 MG PO TABS: 1.0000 | ORAL_TABLET | Freq: Four times a day (QID) | ORAL | Status: AC | PRN

## 2014-04-23 NOTE — ED Notes (Signed)
Dental pain for 6 weeks.

## 2014-04-23 NOTE — Discharge Instructions (Signed)
Dental Pain °A tooth ache may be caused by cavities (tooth decay). Cavities expose the nerve of the tooth to air and hot or cold temperatures. It may come from an infection or abscess (also called a boil or furuncle) around your tooth. It is also often caused by dental caries (tooth decay). This causes the pain you are having. °DIAGNOSIS  °Your caregiver can diagnose this problem by exam. °TREATMENT  °· If caused by an infection, it may be treated with medications which kill germs (antibiotics) and pain medications as prescribed by your caregiver. Take medications as directed. °· Only take over-the-counter or prescription medicines for pain, discomfort, or fever as directed by your caregiver. °· Whether the tooth ache today is caused by infection or dental disease, you should see your dentist as soon as possible for further care. °SEEK MEDICAL CARE IF: °The exam and treatment you received today has been provided on an emergency basis only. This is not a substitute for complete medical or dental care. If your problem worsens or new problems (symptoms) appear, and you are unable to meet with your dentist, call or return to this location. °SEEK IMMEDIATE MEDICAL CARE IF:  °· You have a fever. °· You develop redness and swelling of your face, jaw, or neck. °· You are unable to open your mouth. °· You have severe pain uncontrolled by pain medicine. °MAKE SURE YOU:  °· Understand these instructions. °· Will watch your condition. °· Will get help right away if you are not doing well or get worse. °Document Released: 08/14/2005 Document Revised: 11/06/2011 Document Reviewed: 04/01/2008 °ExitCare® Patient Information ©2015 [employer]. This information is not intended to replace advice given to you by your health care provider. Make sure you discuss any questions you have with your health care provider. ° °Emergency Department Resource Guide °1) Find a Doctor and Pay Out of Pocket °Although you won't have to find out who  is covered by your insurance plan, it is a good idea to ask around and get recommendations. You will then need to call the office and see if the doctor you have chosen will accept you as a new patient and what types of options they offer for patients who are self-pay. Some doctors offer discounts or will set up payment plans for their patients who do not have insurance, but you will need to ask so you aren't surprised when you get to your appointment. ° °2) Contact Your Local Health Department °Not all health departments have doctors that can see patients for sick visits, but many do, so it is worth a call to see if yours does. If you don't know where your local health department is, you can check in your phone book. The CDC also has a tool to help you locate your state's health department, and many state websites also have listings of all of their local health departments. ° °3) Find a Walk-in Clinic °If your illness is not likely to be very severe or complicated, you may want to try a walk in clinic. These are popping up all over the country in pharmacies, drugstores, and shopping centers. They're usually staffed by nurse practitioners or physician assistants that have been trained to treat common illnesses and complaints. They're usually fairly quick and inexpensive. However, if you have serious medical issues or chronic medical problems, these are probably not your best option. ° °No Primary Care Doctor: °- Call [facility] at  832-8000 - they can help you locate a primary   care doctor that  accepts your insurance, provides certain services, etc. °- Physician Referral Service- 1-800-533-3463 ° °Chronic Pain Problems: °Organization         Address  Phone   Notes  °[facility]  (336) 297-2271 Patients need to be referred by their primary care doctor.  ° °Medication Assistance: °Organization         Address  Phone   Notes  °[place] [facility] [address]  [address] °[place], [place] [address] (336) 641-8030 --Must be a resident of [place] °-- Must have NO insurance coverage whatsoever (no Medicaid/ Medicare, etc.) °-- The pt. MUST have a primary care doctor that directs their care regularly and follows them in the community °  °[facility]  (866) 331-1348   °[employer]  (888) 892-1162   ° °Agencies that provide inexpensive medical care: °Organization         Address  Phone   Notes  °[facility]  (336) 832-8035   °[facility]    (336) 832-7272   °[facility] [address] °[place], [place] [address] (336) 832-4777   °[facility] [address], °[place] (336) 271-4999   °[facility]    (336) 373-0678   °[facility]    (336) 272-1050   °[facility] ° [address], [place] Phone:  (336) 832-4444, Fax:  (336) 832-4440 Hours of Operation:  9 am - 6 pm, M-F.  Also accepts Medicaid/Medicare and self-pay.  °[facility] ° [address], [place] Phone: (336) 832-3150, Fax: (336) 832-3151. Hours of Operation:  8:30 am - 5:30 pm, M-F.  Also accepts Medicaid and self-pay.  °[facility] [place] [address], [place] Phone: (336) 878-6027   °[facility] [address], [place], [place] (336)723-1848, Ext. 123 Mondays & Thursdays: 7-9 AM.  First 15 patients are seen on a first come, first serve basis. °  ° °Medicaid-accepting [place] Providers: ° °Organization         Address  Phone   Notes  °[facility] [address], [place] (336) 641-2100 Also accepts self-pay patients.  °[facility] [address], [place] ° (336) 856-9996   °[facility] [address], [place] (336) 288-8857   °[facility] [address] [place] [address], [place] (336) 299-7000   °Veita Bland [address], [place]  ° (336) 373-1557 Only accepts [place] Access Medicaid patients after they have their name applied to their card.  ° °Self-Pay (no insurance) in [place]: ° °Organization         Address  Phone   Notes  °Sickle Cell Patients, [facility] [address], [place] (336) 832-1970   °[facility] [address], [place] (336) 832-4400   °[facility] ° [address], [place] (336) 992-4800   °[facility]/Dr. Osei-Bonsu ° [address] [place] [address], [place] or [address], [place] (336) 841-8500 Phone number for both [place] and [place] locations is the same.  °[facility] [address], [place] (336) 299-0000   °[facility] [address] [place] [address], [place] or [address] (336) 852-7530 °(336) 878-2260   °  [facility] [address], [place] (336) 350-1642, phone; (336) 294-5005, fax Sees patients 1st and 3rd Saturday of every month.  Must not qualify for public or private insurance (i.e. Medicaid, Medicare, [place] Health Choice, Veterans' Benefits) • Household income should be no more than 200% of the poverty level •The clinic cannot treat you if you are pregnant or think you are pregnant • Sexually transmitted diseases are not treated at the clinic.  ° ° °Dental Care: °Organization         Address  Phone  Notes  °[facility] [facility] [address], [place] (336) 641-6152 Accepts children up to age 21 who are enrolled in Medicaid or [place] Health Choice; pregnant women with a Medicaid card; and children who have applied for Medicaid or [place] Health Choice, but were declined, whose parents can pay a reduced fee at time of service.  °[facility] [place]  [address], [place] (336) 641-7733 Accepts children up to age 21 who are  enrolled in Medicaid or [place] Health Choice; pregnant women with a Medicaid card; and children who have applied for Medicaid or [place] Health Choice, but were declined, whose parents can pay a reduced fee at time of service.  °[facility] ° [address], [place] (336) 641-4533 Patients are seen by appointment only. Walk-ins are not accepted. [facility] will see patients 18 years of age and older. °Monday - Tuesday (8am-5pm) °Most Wednesdays (8:30-5pm) °$30 per visit, cash only  °[facility] ° [address], [place] (336) 641-4533 Patients are seen by appointment only. Walk-ins are not accepted. [facility] will see patients 18 years of age and older. °One Wednesday Evening (Monthly: Volunteer Based).  $30 per visit, cash only  °[facility]  (919) 537-3737 for adults; Children under age 4, call Graduate Pediatric Dentistry at (919) [phone]. Children aged 4-14, please call (919) [phone] to request a pediatric application. ° Dental services are provided in all areas of dental care including fillings, crowns and bridges, complete and partial dentures, implants, gum treatment, root canals, and extractions. Preventive care is also provided. Treatment is provided to both adults and children. °Patients are selected via a lottery and there is often a waiting list. °  °[facility] [address], ° ° (336) 763-8833 [email or web address] °  °[facility] [address], [place], [place] (336)723-1848, Ext. 123 Second and Fourth Thursday of each month, opens at 6:30 AM; Clinic ends at 9 AM.  Patients are seen on a first-come first-served basis, and a limited number are seen during each clinic.  ° °[facility] ° [address], [place], [place] (336) 723-7904   Eligibility Requirements °You must have lived in [place], [place], or [place] counties for at least the last three months. °  You  cannot be eligible for state or federal sponsored healthcare insurance, including Veterans Administration, Medicaid, or Medicare. °  You generally cannot be eligible for healthcare insurance through your employer.  °  How to apply: °Eligibility screenings are held every Tuesday and Wednesday afternoon from 1:00 pm until 4:00 pm. You do not need an appointment for the interview!  °[facility] [address], [place], [place] 336-631-2330   °[facility]  336-342-8273   °[facility]  336-703-3100   °[facility]  336-570-6415   ° °Behavioral Health Resources in the Community: °Intensive Outpatient Programs °Organization         Address  Phone  Notes  °[facility] [address], [place], [place] 336-878-6098   °[facility] [address], [place], [place] 336-832-9800   °[facility] [address], [place], [place] ° 336-882-2125   °[facility] [address],  °[place], [place] 1-800-853-5163 or 336-641-4981   °Substance Abuse Resources °Organization         Address  Phone  Notes  °[facility]  336-882-2125   °[facility]  336-784-9470   °[facility]  336-285-9073   °[facility]  336-845-3988   °Residential & Outpatient Substance Abuse Program  1-800-659-3381   °Psychological Services °Organization         Address  Phone  Notes  °[facility]  336- 832-9600   °[facility]  336- 378-7881   °[facility] [address], [place] 1-800-853-5163 or 336-641-4981   ° °Mobile Crisis Teams °Organization         Address  Phone  Notes  °[facility], Mobile Crisis Care Unit  1-877-626-1772   °Assertive °[facility] ° [address] [place], [place] 336-834-9664   °Sharon DeEsch [address] °[place] 336-554-5454   ° °Self-Help/Support  Groups °Organization         Address  Phone             Notes  °[facility] - variety of support groups  336- 373-1402 Call for more information  °Narcotics Anonymous (NA), [facility] [address], °[place]  2 meetings at this location  ° °Residential Treatment Programs °Organization         Address  Phone  Notes  °[facility] [address],    °[place]  1-866-801-8205   °[facility] ° [address], [place], [place] 704-293-8524   °[facility] [address], [place] 336-845-3988 Admissions: 8am-3pm M-F  °[facility] [address]    °[place], [place] 336-841-1104   °[facility] [address], [place], [place] 336-379-7146   °[facility] [address]  °[place], [place] 336-285-9073   °[facility] Intensive Outpatient [address], [place], [place] 336-852-3033   °[facility] [address]  °[place], [place] 1-877-615-2722 or 336-784-9470   °[facility] [address] [place], [place] 336-227-7417 Accepts Medicaid  °[facility] [address]  ° [place] 1-800-659-3381 Substance Abuse/Addiction Treatment  ° °[place] Behavioral Health Resources °Organization         Address  Phone  Notes  °[facility]  (888) 581-9988   °Julie Brannon, PhD [address] [place], [place]   (336) 349-5553 or (336) 951-0000   °[facility]   [address] °[place], [place] (336) 349-4454   °[facility] [address], [place], [place] (336) 342-8316 Insurance/Medicaid/sponsorship through [facility]  °[facility] [address]                                    [place],  (336) 342-8316 Therapy/tele-psych/case  °[facility]   [address]  ° [place], [place] (336) 349-2233    °Dr. Arfeen  (336) 349-4544   °[facility] [place]  [employer] [facility] [place] [facility] 1) [address], [place] °2) [address], [place] °3)  [address], [place] (336) 349-3220 °(336) 342-7768 ° °(336) 342-8140   °[place] [facility] (336) 342-1394 or (336) 342-3537 (After Hours)    ° ° ° °

## 2014-04-23 NOTE — ED Provider Notes (Signed)
CSN: [id number]     Arrival date & time 04/23/14  1931 History  This chart was scribed for Purvis Sheffield, MD by [place], ED Scribe. The patient was seen in [facility]04/[facility]04. The patient's care was started at 8:54 PM.   Chief Complaint  Patient presents with  . Dental Pain     HPI HPI Comments: Carmen Lambert is a 28 y.o. female who presents to the Emergency Department complaining of dental pain onset 6 weeks ago to the lower right aspect of her mouth that radiates up the right side of her face. Pt's friend believes that it is her wisdom tooth that is bothering her. Pt goes on to report that she never had her wisdom teeth pulled. Her friend further states that pt had appointment with her dentist to get a checkup and possibly a few teeth pulled but because of family emergency concerning her mother, she had to go to [place] for over a month and just returned recently. Pt is a current everyday smoker.    Past Medical History  Diagnosis Date  . Depression   . Anxiety   . Mental retardation   . Bronchitis    History reviewed. No pertinent past surgical history. No family history on file. History  Substance Use Topics  . Smoking status: Current Every Day Smoker  . Smokeless tobacco: Never Used  . Alcohol Use: No   OB History   Grav Para Term Preterm Abortions TAB SAB Ect Mult Living                 Review of Systems  Constitutional: Negative for fever and fatigue.  HENT: Positive for dental problem. Negative for congestion and drooling.   Eyes: Negative for pain.  Respiratory: Negative for cough and shortness of breath.   Cardiovascular: Negative for chest pain.  Gastrointestinal: Negative for nausea, vomiting, abdominal pain and diarrhea.  Genitourinary: Negative for dysuria and hematuria.  Musculoskeletal: Negative for neck pain.  Skin: Negative for color change.  Neurological: Negative for dizziness and headaches.  Hematological: Negative for adenopathy.   Psychiatric/Behavioral: Negative for behavioral problems.  All other systems reviewed and are negative.     Allergies  Sulfa drugs cross reactors  Home Medications   Prior to Admission medications   Medication Sig Start Date End Date Taking? Authorizing Provider  albuterol (PROVENTIL HFA;VENTOLIN HFA) 108 (90 BASE) MCG/ACT inhaler Inhale 2 puffs into the lungs every 4 (four) hours as needed for wheezing or shortness of breath. 01/05/14   Carlisle Beers Molpus, MD  albuterol (PROVENTIL HFA;VENTOLIN HFA) 108 (90 BASE) MCG/ACT inhaler Inhale 1-2 puffs into the lungs every 6 (six) hours as needed for wheezing or shortness of breath. 03/02/14   Teressa Lower, NP  azithromycin (ZITHROMAX) 250 MG tablet Take 1 tablet (250 mg total) by mouth daily. Take 1 tablet po daily 03/02/14   Teressa Lower, NP  citalopram (CELEXA) 10 MG tablet Take 10 mg by mouth daily.      Historical Provider, MD  diphenhydrAMINE (BENADRYL) 25 MG tablet Take 25 mg by mouth every 6 (six) hours as needed. For congestion    Historical Provider, MD  ibuprofen (ADVIL,MOTRIN) 200 MG tablet Take 400 mg by mouth every 6 (six) hours as needed. For pain    Historical Provider, MD  LORazepam (ATIVAN) 1 MG tablet Take 1 mg by mouth 2 (two) times daily before a meal.      Historical Provider, MD  neomycin-bacitracin-polymyxin (NEOSPORIN) [phone] ointment Apply to wounds twice daily  and bandage. 01/31/13   John L Molpus, MD  predniSONE (DELTASONE) 50 MG tablet Take 1 tablet (50 mg total) by mouth daily. 10/21/13   Linwood Dibbles, MD   BP 119/81  Pulse 97  Temp(Src) 98 F (36.7 C) (Oral)  Resp 16  Ht  (1.6 m)  Wt 98 lb (44.453 kg)  BMI 17.36 kg/m2  SpO2 100%  LMP 03/23/2014 Physical Exam  Nursing note and vitals reviewed. Constitutional: She is oriented to person, place, and time. She appears well-developed and well-nourished. No distress.  HENT:  Head: Normocephalic and atraumatic.  Mouth/Throat: Oropharynx is clear and moist.   Chronic Ellis 3 fracture to the right lower 2nd molar. This tooth is mildly TTP. Otherwise normal appearing oropharynx. No Trismus. Normal ROM of neck.   Eyes: Conjunctivae and EOM are normal. Pupils are equal, round, and reactive to light.  Neck: Normal range of motion. Neck supple. No tracheal deviation present.  Cardiovascular: Normal rate, regular rhythm and normal heart sounds.  Exam reveals no gallop and no friction rub.   No murmur heard. Pulmonary/Chest: Effort normal and breath sounds normal. No respiratory distress. She has no wheezes. She has no rales.  Abdominal: Soft. Bowel sounds are normal. She exhibits no distension. There is no tenderness. There is no rebound and no guarding.  Musculoskeletal: Normal range of motion. She exhibits no edema and no tenderness.  Neurological: She is alert and oriented to person, place, and time.  Skin: Skin is warm and dry.  Psychiatric: She has a normal mood and affect. Her behavior is normal.    ED Course  Procedures (including critical care time) Labs Review Labs Reviewed - No data to display  Results for orders placed during the hospital encounter of 08/18/12  PREGNANCY, URINE      Result Value Ref Range   Preg Test, Ur NEGATIVE  NEGATIVE   No results found.  Imaging Review No results found.   EKG Interpretation None     Medications - No data to display  9:00 PM- Treatment plan was discussed with patient who verbalizes understanding and agrees.   MDM   Final diagnoses:  Pain, dental    9:45 AM 28 y.o. female here w/ dental pain. Chronic appearing fx to right lower molar. Otherwise well appearing. Will rec pain meds/PCN and dental f/u. Return precautions provided.   Medications given during this visit Medications - No data to display  Discharge Medication List as of 04/23/2014  9:04 PM    START taking these medications   Details  oxyCODONE-acetaminophen (PERCOCET) 5-325 MG per tablet Take 1 tablet by mouth every 6  (six) hours as needed for moderate pain., Starting 04/23/2014, Until Discontinued, Print    penicillin v potassium (VEETID) 500 MG tablet Take 1 tablet (500 mg total) by mouth 4 (four) times daily., Starting 04/23/2014, Last dose on Thu 04/30/14, Print          I personally performed the services described in this documentation, which was scribed in my presence. The recorded information has been reviewed and is accurate.    Purvis Sheffield, MD 04/24/14 [phone]

## 2014-05-25 ENCOUNTER — Emergency Department (HOSPITAL_BASED_OUTPATIENT_CLINIC_OR_DEPARTMENT_OTHER)
Admission: EM | Admit: 2014-05-25 | Discharge: 2014-05-25 | Disposition: A | Discharge status: Home / Self Care | Payer: Medicaid Other | Attending: Emergency Medicine | Admitting: Emergency Medicine

## 2014-05-25 ENCOUNTER — Encounter (HOSPITAL_BASED_OUTPATIENT_CLINIC_OR_DEPARTMENT_OTHER): Payer: Self-pay | Admitting: Emergency Medicine

## 2014-05-25 ENCOUNTER — Emergency Department (HOSPITAL_BASED_OUTPATIENT_CLINIC_OR_DEPARTMENT_OTHER): Payer: Medicaid Other

## 2014-05-25 DIAGNOSIS — IMO0002 Reserved for concepts with insufficient information to code with codable children: Secondary | ICD-10-CM | POA: Insufficient documentation

## 2014-05-25 DIAGNOSIS — F329 Major depressive disorder, single episode, unspecified: Secondary | ICD-10-CM | POA: Diagnosis not present

## 2014-05-25 DIAGNOSIS — Z792 Long term (current) use of antibiotics: Secondary | ICD-10-CM | POA: Insufficient documentation

## 2014-05-25 DIAGNOSIS — F3289 Other specified depressive episodes: Secondary | ICD-10-CM | POA: Insufficient documentation

## 2014-05-25 DIAGNOSIS — R062 Wheezing: Secondary | ICD-10-CM | POA: Insufficient documentation

## 2014-05-25 DIAGNOSIS — F17200 Nicotine dependence, unspecified, uncomplicated: Secondary | ICD-10-CM | POA: Diagnosis not present

## 2014-05-25 DIAGNOSIS — F411 Generalized anxiety disorder: Secondary | ICD-10-CM | POA: Diagnosis not present

## 2014-05-25 DIAGNOSIS — J069 Acute upper respiratory infection, unspecified: Secondary | ICD-10-CM | POA: Diagnosis not present

## 2014-05-25 DIAGNOSIS — R05 Cough: Secondary | ICD-10-CM | POA: Insufficient documentation

## 2014-05-25 DIAGNOSIS — R059 Cough, unspecified: Secondary | ICD-10-CM | POA: Diagnosis present

## 2014-05-25 DIAGNOSIS — Z79899 Other long term (current) drug therapy: Secondary | ICD-10-CM | POA: Diagnosis not present

## 2014-05-25 MED ORDER — PREDNISONE 50 MG PO TABS: ORAL_TABLET | ORAL | Status: DC

## 2014-05-25 MED ORDER — ALBUTEROL (5 MG/ML) CONTINUOUS INHALATION SOLN
10.0000 mg/h | INHALATION_SOLUTION | Freq: Once | RESPIRATORY_TRACT | Status: AC
  Administered 2014-05-25: 10 mg/h via RESPIRATORY_TRACT
  Filled 2014-05-25: qty 20

## 2014-05-25 MED ORDER — ACETAMINOPHEN 325 MG PO TABS
650.0000 mg | ORAL_TABLET | Freq: Once | ORAL | Status: AC
  Administered 2014-05-25: 650 mg via ORAL
  Filled 2014-05-25: qty 2

## 2014-05-25 MED ORDER — ALBUTEROL SULFATE (2.5 MG/3ML) 0.083% IN NEBU: 5.0000 mg | INHALATION_SOLUTION | Freq: Once | RESPIRATORY_TRACT | Status: DC

## 2014-05-25 MED ORDER — ALBUTEROL SULFATE HFA 108 (90 BASE) MCG/ACT IN AERS: 1.0000 | INHALATION_SPRAY | Freq: Four times a day (QID) | RESPIRATORY_TRACT | Status: DC | PRN

## 2014-05-25 MED ORDER — ALBUTEROL SULFATE (2.5 MG/3ML) 0.083% IN NEBU: 2.5000 mg | INHALATION_SOLUTION | Freq: Once | RESPIRATORY_TRACT | Status: DC

## 2014-05-25 MED ORDER — PREDNISONE 50 MG PO TABS
60.0000 mg | ORAL_TABLET | Freq: Once | ORAL | Status: AC
  Administered 2014-05-25: 60 mg via ORAL
  Filled 2014-05-25 (×2): qty 1

## 2014-05-25 NOTE — ED Provider Notes (Signed)
CSN: [id number]     Arrival date & time 05/25/14  2005 History  This chart was scribed for Purvis Sheffield, MD by Charline Bills, ED Scribe. The patient was seen in room MH09/MH09. Patient's care was started at 9:44 PM.   Chief Complaint  Patient presents with  . URI   Patient is a 28 y.o. female presenting with URI. The history is provided by the patient. No language interpreter was used.  URI Presenting symptoms: congestion, cough and rhinorrhea   Presenting symptoms: no fatigue   Severity:  Moderate Duration:  4 days Timing:  Constant Progression:  Worsening Chronicity:  Recurrent Relieved by:  Nothing Ineffective treatments:  Inhaler Associated symptoms: wheezing   Associated symptoms: no headaches    HPI Comments: Arriana Lohmann is a 28 y.o. female, with a h/o bronchitis, who presents to the Emergency Department complaining of gradually worsening cough onset 4 days ago. She reports associated rhinorrhea, congestion, wheezing, SOB, chest pain with coughing, vomiting, subjective fever. Pt is a nonsmoker. Pt has tried inhaler with mild relief. No h/o asthma.  Past Medical History  Diagnosis Date  . Depression   . Anxiety   . Mental retardation   . Bronchitis    History reviewed. No pertinent past surgical history. No family history on file. History  Substance Use Topics  . Smoking status: Current Every Day Smoker  . Smokeless tobacco: Never Used  . Alcohol Use: No   OB History   Grav Para Term Preterm Abortions TAB SAB Ect Mult Living                 Review of Systems  Constitutional: Negative for appetite change and fatigue.  HENT: Positive for congestion and rhinorrhea. Negative for ear discharge and sinus pressure.   Eyes: Negative for discharge.  Respiratory: Positive for cough, shortness of breath and wheezing.   Cardiovascular: Negative for chest pain (with coughing).  Gastrointestinal: Positive for vomiting. Negative for abdominal pain and diarrhea.   Genitourinary: Negative for frequency and hematuria.  Musculoskeletal: Negative for back pain.  Skin: Negative for rash.  Neurological: Negative for seizures and headaches.  Psychiatric/Behavioral: Negative for hallucinations.  All other systems reviewed and are negative.  Allergies  Sulfa drugs cross reactors  Home Medications   Prior to Admission medications   Medication Sig Start Date End Date Taking? Authorizing Provider  albuterol (PROVENTIL HFA;VENTOLIN HFA) 108 (90 BASE) MCG/ACT inhaler Inhale 2 puffs into the lungs every 4 (four) hours as needed for wheezing or shortness of breath. 01/05/14   Carlisle Beers Molpus, MD  albuterol (PROVENTIL HFA;VENTOLIN HFA) 108 (90 BASE) MCG/ACT inhaler Inhale 1-2 puffs into the lungs every 6 (six) hours as needed for wheezing or shortness of breath. 03/02/14   Teressa Lower, NP  azithromycin (ZITHROMAX) 250 MG tablet Take 1 tablet (250 mg total) by mouth daily. Take 1 tablet po daily 03/02/14   Teressa Lower, NP  citalopram (CELEXA) 10 MG tablet Take 10 mg by mouth daily.      Historical Provider, MD  diphenhydrAMINE (BENADRYL) 25 MG tablet Take 25 mg by mouth every 6 (six) hours as needed. For congestion    Historical Provider, MD  ibuprofen (ADVIL,MOTRIN) 200 MG tablet Take 400 mg by mouth every 6 (six) hours as needed. For pain    Historical Provider, MD  LORazepam (ATIVAN) 1 MG tablet Take 1 mg by mouth 2 (two) times daily before a meal.      Historical Provider, MD  neomycin-bacitracin-polymyxin (NEOSPORIN) [phone]  ointment Apply to wounds twice daily and bandage. 01/31/13   Carlisle Beers Molpus, MD  oxyCODONE-acetaminophen (PERCOCET) 5-325 MG per tablet Take 1 tablet by mouth every 6 (six) hours as needed for moderate pain. 04/23/14   Purvis Sheffield, MD  predniSONE (DELTASONE) 50 MG tablet Take 1 tablet (50 mg total) by mouth daily. 10/21/13   Linwood Dibbles, MD   Triage Vitals: BP 113/72  Pulse 94  Temp(Src) 98.3 F (36.8 C) (Oral)  Resp 20  Ht   (1.549 m)  Wt 98 lb (44.453 kg)  BMI 18.53 kg/m2  SpO2 95%  LMP 05/20/2014 Physical Exam  Nursing note and vitals reviewed. Constitutional: She is oriented to person, place, and time. She appears well-developed and well-nourished. No distress.  HENT:  Head: Normocephalic and atraumatic.  Right Ear: Hearing normal.  Left Ear: Hearing normal.  Nose: Nose normal.  Mouth/Throat: Oropharynx is clear and moist and mucous membranes are normal.  Eyes: Conjunctivae and EOM are normal. Pupils are equal, round, and reactive to light.  Neck: Normal range of motion. Neck supple.  Cardiovascular: Regular rhythm, S1 normal and S2 normal.  Exam reveals no gallop and no friction rub.   No murmur heard. Pulmonary/Chest: Effort normal. No respiratory distress. She has wheezes. She exhibits no tenderness.  Diffuse wheezing heard bilaterally   Abdominal: Soft. Normal appearance and bowel sounds are normal. There is no hepatosplenomegaly. There is no tenderness. There is no rebound, no guarding, no tenderness at McBurney's point and negative Murphy's sign. No hernia.  Musculoskeletal: Normal range of motion.  Neurological: She is alert and oriented to person, place, and time. She has normal strength. No cranial nerve deficit or sensory deficit. Coordination normal. GCS eye subscore is 4. GCS verbal subscore is 5. GCS motor subscore is 6.  Skin: Skin is warm, dry and intact. No rash noted. No cyanosis.  Psychiatric: She has a normal mood and affect. Her speech is normal and behavior is normal. Thought content normal.   ED Course  Procedures (including critical care time) DIAGNOSTIC STUDIES: Oxygen Saturation is 95% on RA, adequate by my interpretation.    COORDINATION OF CARE: 9:49 PM-Discussed treatment plan which includes CXR with pt at bedside and pt agreed to plan.   Labs Review Labs Reviewed - No data to display  Imaging Review Dg Chest 2 View  05/25/2014   CLINICAL DATA:  Upper respiratory  infection with cough and congestion.  EXAM: CHEST  2 VIEW  COMPARISON:  03/02/2014  FINDINGS: The heart size and mediastinal contours are within normal limits. Both lungs are clear. The visualized skeletal structures are unremarkable.  IMPRESSION: No active cardiopulmonary disease.   Electronically Signed   By: Elberta Fortis M.D.   On: 05/25/2014 20:45    EKG Interpretation None      MDM   Final diagnoses:  Viral URI  Wheezing    11:10 PM 29 y.o. female who pw viral URI sx. CP w/ coughing. She has also had wheezing. No official dx of asthma, non-smoker. Pt AFVSS here. Mild diffuse wheezing on exam. Will give alb neb, po prednisone. CXR non-contrib.   11:24 PM: Lungs now almost completely free of wheezing. Pt feeling much better. Will give course of steroids. Likely viral URI w/ wheezing. Pt likely has reactive airway dis.  I have discussed the diagnosis/risks/treatment options with the patient and believe the pt to be eligible for discharge home to follow-up with her pcp as needed. We also discussed returning to the  ED immediately if new or worsening sx occur. We discussed the sx which are most concerning (e.g., worsening sob, fever, worsening cp) that necessitate immediate return. Medications administered to the patient during their visit and any new prescriptions provided to the patient are listed below.  Medications given during this visit Medications  predniSONE (DELTASONE) tablet 60 mg (60 mg Oral Given 05/25/14 2229)  acetaminophen (TYLENOL) tablet 650 mg (650 mg Oral Given 05/25/14 2229)  albuterol (PROVENTIL,VENTOLIN) solution continuous neb (0 mg/hr Nebulization Hold 05/25/14 2248)    New Prescriptions   ALBUTEROL (PROVENTIL HFA;VENTOLIN HFA) 108 (90 BASE) MCG/ACT INHALER    Inhale 1-2 puffs into the lungs every 6 (six) hours as needed for wheezing or shortness of breath.   PREDNISONE (DELTASONE) 50 MG TABLET    Take 1 tablet by mouth daily for the next 4 days.     I personally  performed the services described in this documentation, which was scribed in my presence. The recorded information has been reviewed and is accurate.    Purvis Sheffield, MD 05/25/14 2326

## 2014-05-25 NOTE — ED Notes (Signed)
Coughing so hard she makes herself vomit. Stuffy nose. States she thinks it is the pollen.

## 2014-07-12 ENCOUNTER — Emergency Department (HOSPITAL_BASED_OUTPATIENT_CLINIC_OR_DEPARTMENT_OTHER)
Admission: EM | Admit: 2014-07-12 | Discharge: 2014-07-12 | Disposition: A | Discharge status: Home / Self Care | Payer: Medicaid Other | Attending: Emergency Medicine | Admitting: Emergency Medicine

## 2014-07-12 ENCOUNTER — Emergency Department (HOSPITAL_BASED_OUTPATIENT_CLINIC_OR_DEPARTMENT_OTHER): Payer: Medicaid Other

## 2014-07-12 ENCOUNTER — Encounter (HOSPITAL_BASED_OUTPATIENT_CLINIC_OR_DEPARTMENT_OTHER): Payer: Self-pay | Admitting: Emergency Medicine

## 2014-07-12 DIAGNOSIS — F329 Major depressive disorder, single episode, unspecified: Secondary | ICD-10-CM | POA: Insufficient documentation

## 2014-07-12 DIAGNOSIS — Z79899 Other long term (current) drug therapy: Secondary | ICD-10-CM | POA: Diagnosis not present

## 2014-07-12 DIAGNOSIS — F79 Unspecified intellectual disabilities: Secondary | ICD-10-CM | POA: Insufficient documentation

## 2014-07-12 DIAGNOSIS — R112 Nausea with vomiting, unspecified: Secondary | ICD-10-CM | POA: Insufficient documentation

## 2014-07-12 DIAGNOSIS — R059 Cough, unspecified: Secondary | ICD-10-CM

## 2014-07-12 DIAGNOSIS — J209 Acute bronchitis, unspecified: Secondary | ICD-10-CM | POA: Diagnosis not present

## 2014-07-12 DIAGNOSIS — J40 Bronchitis, not specified as acute or chronic: Secondary | ICD-10-CM

## 2014-07-12 DIAGNOSIS — R05 Cough: Secondary | ICD-10-CM

## 2014-07-12 DIAGNOSIS — Z791 Long term (current) use of non-steroidal anti-inflammatories (NSAID): Secondary | ICD-10-CM | POA: Insufficient documentation

## 2014-07-12 DIAGNOSIS — Z79891 Long term (current) use of opiate analgesic: Secondary | ICD-10-CM | POA: Diagnosis not present

## 2014-07-12 DIAGNOSIS — F419 Anxiety disorder, unspecified: Secondary | ICD-10-CM | POA: Diagnosis not present

## 2014-07-12 DIAGNOSIS — Z792 Long term (current) use of antibiotics: Secondary | ICD-10-CM | POA: Diagnosis not present

## 2014-07-12 DIAGNOSIS — J069 Acute upper respiratory infection, unspecified: Secondary | ICD-10-CM | POA: Diagnosis present

## 2014-07-12 DIAGNOSIS — Z720 Tobacco use: Secondary | ICD-10-CM | POA: Diagnosis not present

## 2014-07-12 MED ORDER — ALBUTEROL SULFATE HFA 108 (90 BASE) MCG/ACT IN AERS
1.0000 | INHALATION_SPRAY | RESPIRATORY_TRACT | Status: DC | PRN
  Administered 2014-07-12: 2 via RESPIRATORY_TRACT

## 2014-07-12 MED ORDER — ALBUTEROL SULFATE HFA 108 (90 BASE) MCG/ACT IN AERS: 1.0000 | INHALATION_SPRAY | Freq: Four times a day (QID) | RESPIRATORY_TRACT | Status: DC | PRN

## 2014-07-12 MED ORDER — PREDNISONE 20 MG PO TABS: ORAL_TABLET | ORAL | Status: DC

## 2014-07-12 MED ORDER — ALBUTEROL SULFATE HFA 108 (90 BASE) MCG/ACT IN AERS
INHALATION_SPRAY | RESPIRATORY_TRACT | Status: AC
  Administered 2014-07-12: 2 via RESPIRATORY_TRACT
  Filled 2014-07-12: qty 6.7

## 2014-07-12 MED ORDER — DOXYCYCLINE HYCLATE 100 MG PO CAPS: 100.0000 mg | ORAL_CAPSULE | Freq: Two times a day (BID) | ORAL | Status: DC

## 2014-07-12 MED ORDER — BENZONATATE 100 MG PO CAPS: 100.0000 mg | ORAL_CAPSULE | Freq: Three times a day (TID) | ORAL | Status: AC

## 2014-07-12 NOTE — ED Provider Notes (Signed)
CSN: [id number]     Arrival date & time 07/12/14  1411 History  This chart was scribed for Rolan BuccoMelanie Monzerat Handler, MD by Roxy Cedarhandni Bhalodia, ED Scribe. This patient was seen in room MH05/MH05 and the patient's care was started at 3:13 PM.   Chief Complaint  Patient presents with  . Cough  . URI  . Emesis   Patient is a 28 y.o. female presenting with cough, URI, and vomiting. The history is provided by the patient. No language interpreter was used.  Cough Associated symptoms: shortness of breath and wheezing   Associated symptoms: no chest pain, no chills, no diaphoresis, no fever, no headaches, no rash and no rhinorrhea   URI Presenting symptoms: cough and fatigue   Presenting symptoms: no congestion, no fever and no rhinorrhea   Associated symptoms: wheezing   Associated symptoms: no arthralgias, no headaches and no sneezing   Emesis Associated symptoms: URI   Associated symptoms: no abdominal pain, no arthralgias, no chills, no diarrhea and no headaches     HPI Comments: Jasmine DecemberLaura Dispenza is a 28 y.o. female with a history of asthma, and mental retardation, who presents to the Emergency Department complaining of coughing, wheezing, rhinorrhea, and congestion for the past few weeks. She states that the symptoms have worsened in the past few days with onset of episodes of emesis. She states that she has run out of her inhaler. She denies associated fever. Past Medical History  Diagnosis Date  . Depression   . Anxiety   . Mental retardation   . Bronchitis    History reviewed. No pertinent past surgical history. No family history on file. History  Substance Use Topics  . Smoking status: Current Every Day Smoker  . Smokeless tobacco: Never Used  . Alcohol Use: No   OB History    No data available     Review of Systems  Constitutional: Positive for fatigue. Negative for fever, chills and diaphoresis.  HENT: Negative for congestion, rhinorrhea and sneezing.   Eyes: Negative.   Respiratory:  Positive for cough, shortness of breath and wheezing. Negative for chest tightness.   Cardiovascular: Negative for chest pain and leg swelling.  Gastrointestinal: Positive for nausea and vomiting. Negative for abdominal pain, diarrhea and blood in stool.  Genitourinary: Negative for frequency, hematuria, flank pain and difficulty urinating.  Musculoskeletal: Negative for back pain and arthralgias.  Skin: Negative for rash.  Neurological: Negative for dizziness, speech difficulty, weakness, numbness and headaches.   Allergies  Sulfa drugs cross reactors  Home Medications   Prior to Admission medications   Medication Sig Start Date End Date Taking? Authorizing Provider  albuterol (PROVENTIL HFA;VENTOLIN HFA) 108 (90 BASE) MCG/ACT inhaler Inhale 2 puffs into the lungs every 4 (four) hours as needed for wheezing or shortness of breath. 01/05/14  Yes John L Molpus, MD  albuterol (PROVENTIL HFA;VENTOLIN HFA) 108 (90 BASE) MCG/ACT inhaler Inhale 1-2 puffs into the lungs every 6 (six) hours as needed for wheezing or shortness of breath. 03/02/14  Yes Teressa LowerVrinda Pickering, NP  albuterol (PROVENTIL HFA;VENTOLIN HFA) 108 (90 BASE) MCG/ACT inhaler Inhale 1-2 puffs into the lungs every 6 (six) hours as needed for wheezing or shortness of breath. 05/25/14  Yes Purvis SheffieldForrest Harrison, MD  citalopram (CELEXA) 10 MG tablet Take 10 mg by mouth daily.     Yes Historical Provider, MD  ibuprofen (ADVIL,MOTRIN) 200 MG tablet Take 400 mg by mouth every 6 (six) hours as needed. For pain   Yes Historical Provider, MD  benzonatate (  TESSALON) 100 MG capsule Take 1 capsule (100 mg total) by mouth every 8 (eight) hours. 07/12/14   Rolan BuccoMelanie Broedy Osbourne, MD  diphenhydrAMINE (BENADRYL) 25 MG tablet Take 25 mg by mouth every 6 (six) hours as needed. For congestion    Historical Provider, MD  doxycycline (VIBRAMYCIN) 100 MG capsule Take 1 capsule (100 mg total) by mouth 2 (two) times daily. One po bid x 7 days 07/12/14   Rolan BuccoMelanie Cameron Katayama, MD   LORazepam (ATIVAN) 1 MG tablet Take 1 mg by mouth 2 (two) times daily before a meal.      Historical Provider, MD  neomycin-bacitracin-polymyxin (NEOSPORIN) [phone] ointment Apply to wounds twice daily and bandage. 01/31/13   Carlisle BeersJohn L Molpus, MD  oxyCODONE-acetaminophen (PERCOCET) 5-325 MG per tablet Take 1 tablet by mouth every 6 (six) hours as needed for moderate pain. 04/23/14   Purvis SheffieldForrest Harrison, MD  predniSONE (DELTASONE) 20 MG tablet 3 tabs po day one, then 2 po daily x 4 days 07/12/14   Rolan BuccoMelanie Sundee Garland, MD   Triage Vitals: BP 117/72 mmHg  Temp(Src) 98.1 F (36.7 C) (Oral)  Resp 18  Ht 5\' 3"  (1.6 m)  Wt 99 lb (44.906 kg)  BMI 17.54 kg/m2  SpO2 97%  LMP 07/11/2014  Physical Exam  Constitutional: She is oriented to person, place, and time. She appears well-developed and well-nourished.  HENT:  Head: Normocephalic and atraumatic.  Eyes: Pupils are equal, round, and reactive to light.  Neck: Normal range of motion. Neck supple.  Cardiovascular: Normal rate, regular rhythm and normal heart sounds.   Pulmonary/Chest: Effort normal and breath sounds normal. No respiratory distress. She has no wheezes. She has no rales. She exhibits no tenderness.  Mild expiratory wheezing. No increase work with breathing.  Abdominal: Soft. Bowel sounds are normal. There is no tenderness. There is no rebound and no guarding.  Musculoskeletal: Normal range of motion. She exhibits no edema.  Lymphadenopathy:    She has no cervical adenopathy.  Neurological: She is alert and oriented to person, place, and time.  Skin: Skin is warm and dry. No rash noted.  Psychiatric: She has a normal mood and affect.  Nursing note and vitals reviewed.  ED Course  Procedures (including critical care time)  DIAGNOSTIC STUDIES: Oxygen Saturation is 97% on RA, normal by my interpretation.    COORDINATION OF CARE: 3:18 PM- Discussed plans to order diagnostic CXR. Will give patient inhaler and steroid medication. Pt  advised of plan for treatment and pt agrees.  Labs Review Labs Reviewed - No data to display  Imaging Review Dg Chest 2 View  07/12/2014   CLINICAL DATA:  Fever with cough for several days.  EXAM: CHEST  2 VIEW  COMPARISON:  Chest radiograph 05/25/2014  FINDINGS: The heart, mediastinal, and hilar contours are normal. The trachea is midline. There is asymmetric of peribronchial thickening in the peripheral aspect of the right perihilar lung. No consolidative airspace disease is seen. Negative for pleural effusion. Negative for pneumothorax. Trachea midline. Bones and upper abdomen are unremarkable.  IMPRESSION: Subtle asymmetric peribronchial thickening in the peripheral right perihilar lung. This could be due to acute bronchitis, but very early/mild airspace disease is not completely excluded.   Electronically Signed   By: Britta MccreedySusan  Turner M.D.   On: 07/12/2014 15:56     EKG Interpretation None     MDM   Final diagnoses:  Bronchitis    Pt well appearing, mild wheezing, no increased WOB.  No vomiting in the ED.  Was  dispensed an inhaler, also given rx for prednisone burst and doxycycline.  Advised to return if symptoms worsen or are not improving.  I personally performed the services described in this documentation, which was scribed in my presence. The recorded information has been reviewed and is accurate.  Rolan Bucco, MD 07/12/14 [phone]

## 2014-07-12 NOTE — ED Notes (Signed)
Pt presents to ED with complaints of cough and cold symptoms and vomiting for the past few days.

## 2014-07-12 NOTE — Discharge Instructions (Signed)
Upper Respiratory Infection, Adult An upper respiratory infection (URI) is also sometimes known as the common cold. The upper respiratory tract includes the nose, sinuses, throat, trachea, and bronchi. Bronchi are the airways leading to the lungs. Most people improve within 1 week, but symptoms can last up to 2 weeks. A residual cough may last even longer.  CAUSES Many different viruses can infect the tissues lining the upper respiratory tract. The tissues become irritated and inflamed and often become very moist. Mucus production is also common. A cold is contagious. You can easily spread the virus to others by oral contact. This includes kissing, sharing a glass, coughing, or sneezing. Touching your mouth or nose and then touching a surface, which is then touched by another person, can also spread the virus. SYMPTOMS  Symptoms typically develop 1 to 3 days after you come in contact with a cold virus. Symptoms vary from person to person. They may include:  Runny nose.  Sneezing.  Nasal congestion.  Sinus irritation.  Sore throat.  Loss of voice (laryngitis).  Cough.  Fatigue.  Muscle aches.  Loss of appetite.  Headache.  Low-grade fever. DIAGNOSIS  You might diagnose your own cold based on familiar symptoms, since most people get a cold 2 to 3 times a year. Your caregiver can confirm this based on your exam. Most importantly, your caregiver can check that your symptoms are not due to another disease such as strep throat, sinusitis, pneumonia, asthma, or epiglottitis. Blood tests, throat tests, and X-rays are not necessary to diagnose a common cold, but they may sometimes be helpful in excluding other more serious diseases. Your caregiver will decide if any further tests are required. RISKS AND COMPLICATIONS  You may be at risk for a more severe case of the common cold if you smoke cigarettes, have chronic heart disease (such as heart failure) or lung disease (such as asthma), or if  you have a weakened immune system. The very young and very old are also at risk for more serious infections. Bacterial sinusitis, middle ear infections, and bacterial pneumonia can complicate the common cold. The common cold can worsen asthma and chronic obstructive pulmonary disease (COPD). Sometimes, these complications can require emergency medical care and may be life-threatening. PREVENTION  The best way to protect against getting a cold is to practice good hygiene. Avoid oral or hand contact with people with cold symptoms. Wash your hands often if contact occurs. There is no clear evidence that vitamin C, vitamin E, echinacea, or exercise reduces the chance of developing a cold. However, it is always recommended to get plenty of rest and practice good nutrition. TREATMENT  Treatment is directed at relieving symptoms. There is no cure. Antibiotics are not effective, because the infection is caused by a virus, not by bacteria. Treatment may include:  Increased fluid intake. Sports drinks offer valuable electrolytes, sugars, and fluids.  Breathing heated mist or steam (vaporizer or shower).  Eating chicken soup or other clear broths, and maintaining good nutrition.  Getting plenty of rest.  Using gargles or lozenges for comfort.  Controlling fevers with ibuprofen or acetaminophen as directed by your caregiver.  Increasing usage of your inhaler if you have asthma. Zinc gel and zinc lozenges, taken in the first 24 hours of the common cold, can shorten the duration and lessen the severity of symptoms. Pain medicines may help with fever, muscle aches, and throat pain. A variety of non-prescription medicines are available to treat congestion and runny nose. Your caregiver   can make recommendations and may suggest nasal or lung inhalers for other symptoms.  HOME CARE INSTRUCTIONS   Only take over-the-counter or prescription medicines for pain, discomfort, or fever as directed by your  caregiver.  Use a warm mist humidifier or inhale steam from a shower to increase air moisture. This may keep secretions moist and make it easier to breathe.  Drink enough water and fluids to keep your urine clear or pale yellow.  Rest as needed.  Return to work when your temperature has returned to normal or as your caregiver advises. You may need to stay home longer to avoid infecting others. You can also use a face mask and careful hand washing to prevent spread of the virus. SEEK MEDICAL CARE IF:   After the first few days, you feel you are getting worse rather than better.  You need your caregiver's advice about medicines to control symptoms.  You develop chills, worsening shortness of breath, or brown or red sputum. These may be signs of pneumonia.  You develop yellow or brown nasal discharge or pain in the face, especially when you bend forward. These may be signs of sinusitis.  You develop a fever, swollen neck glands, pain with swallowing, or white areas in the back of your throat. These may be signs of strep throat. SEEK IMMEDIATE MEDICAL CARE IF:   You have a fever.  You develop severe or persistent headache, ear pain, sinus pain, or chest pain.  You develop wheezing, a prolonged cough, cough up blood, or have a change in your usual mucus (if you have chronic lung disease).  You develop sore muscles or a stiff neck. Document Released: 02/07/2001 Document Revised: 11/06/2011 Document Reviewed: 11/19/2013 [employer] Patient Information 2015 [employer]. This information is not intended to replace advice given to you by your health care provider. Make sure you discuss any questions you have with your health care provider.  

## 2014-08-25 ENCOUNTER — Encounter (HOSPITAL_BASED_OUTPATIENT_CLINIC_OR_DEPARTMENT_OTHER): Payer: Self-pay | Admitting: *Deleted

## 2014-08-25 ENCOUNTER — Emergency Department (HOSPITAL_BASED_OUTPATIENT_CLINIC_OR_DEPARTMENT_OTHER)
Admission: EM | Admit: 2014-08-25 | Discharge: 2014-08-25 | Disposition: A | Discharge status: Home / Self Care | Payer: Medicaid Other | Attending: Emergency Medicine | Admitting: Emergency Medicine

## 2014-08-25 ENCOUNTER — Emergency Department (HOSPITAL_BASED_OUTPATIENT_CLINIC_OR_DEPARTMENT_OTHER): Payer: Medicaid Other

## 2014-08-25 DIAGNOSIS — R05 Cough: Secondary | ICD-10-CM

## 2014-08-25 DIAGNOSIS — Z792 Long term (current) use of antibiotics: Secondary | ICD-10-CM | POA: Insufficient documentation

## 2014-08-25 DIAGNOSIS — J4541 Moderate persistent asthma with (acute) exacerbation: Secondary | ICD-10-CM | POA: Diagnosis not present

## 2014-08-25 DIAGNOSIS — Z79899 Other long term (current) drug therapy: Secondary | ICD-10-CM | POA: Diagnosis not present

## 2014-08-25 DIAGNOSIS — Z7951 Long term (current) use of inhaled steroids: Secondary | ICD-10-CM | POA: Insufficient documentation

## 2014-08-25 DIAGNOSIS — F329 Major depressive disorder, single episode, unspecified: Secondary | ICD-10-CM | POA: Diagnosis not present

## 2014-08-25 DIAGNOSIS — Z7722 Contact with and (suspected) exposure to environmental tobacco smoke (acute) (chronic): Secondary | ICD-10-CM

## 2014-08-25 DIAGNOSIS — F419 Anxiety disorder, unspecified: Secondary | ICD-10-CM | POA: Insufficient documentation

## 2014-08-25 DIAGNOSIS — F79 Unspecified intellectual disabilities: Secondary | ICD-10-CM | POA: Insufficient documentation

## 2014-08-25 DIAGNOSIS — R059 Cough, unspecified: Secondary | ICD-10-CM

## 2014-08-25 DIAGNOSIS — R0602 Shortness of breath: Secondary | ICD-10-CM | POA: Diagnosis present

## 2014-08-25 MED ORDER — ALBUTEROL SULFATE (2.5 MG/3ML) 0.083% IN NEBU
2.5000 mg | INHALATION_SOLUTION | Freq: Once | RESPIRATORY_TRACT | Status: AC
  Administered 2014-08-25: 2.5 mg via RESPIRATORY_TRACT

## 2014-08-25 MED ORDER — PREDNISONE 20 MG PO TABS: ORAL_TABLET | ORAL | Status: AC

## 2014-08-25 MED ORDER — PREDNISONE 50 MG PO TABS
60.0000 mg | ORAL_TABLET | Freq: Once | ORAL | Status: AC
  Administered 2014-08-25: 60 mg via ORAL
  Filled 2014-08-25 (×2): qty 1

## 2014-08-25 MED ORDER — BECLOMETHASONE DIPROPIONATE 40 MCG/ACT IN AERS
1.0000 | INHALATION_SPRAY | Freq: Two times a day (BID) | RESPIRATORY_TRACT | Status: AC
Start: 2014-08-25 — End: ?

## 2014-08-25 MED ORDER — IPRATROPIUM-ALBUTEROL 0.5-2.5 (3) MG/3ML IN SOLN
3.0000 mL | Freq: Once | RESPIRATORY_TRACT | Status: AC
  Administered 2014-08-25: 3 mL via RESPIRATORY_TRACT

## 2014-08-25 MED ORDER — ALBUTEROL SULFATE (2.5 MG/3ML) 0.083% IN NEBU
INHALATION_SOLUTION | RESPIRATORY_TRACT | Status: AC
  Filled 2014-08-25: qty 3

## 2014-08-25 MED ORDER — ALBUTEROL SULFATE HFA 108 (90 BASE) MCG/ACT IN AERS: 1.0000 | INHALATION_SPRAY | RESPIRATORY_TRACT | Status: AC | PRN

## 2014-08-25 MED ORDER — IPRATROPIUM-ALBUTEROL 0.5-2.5 (3) MG/3ML IN SOLN
RESPIRATORY_TRACT | Status: AC
  Filled 2014-08-25: qty 3

## 2014-08-25 NOTE — ED Notes (Signed)
Pt c/o SOB x 1 day HX asthma  Pt is out of inhaler

## 2014-08-25 NOTE — Discharge Instructions (Signed)

## 2014-08-26 NOTE — ED Provider Notes (Signed)
CSN: [id number]     Arrival date & time 08/25/14  1509 History   First MD Initiated Contact with Patient 08/25/14 1529     Chief Complaint  Patient presents with  . Shortness of Breath     (Consider location/radiation/quality/duration/timing/severity/associated sxs/prior Treatment) HPI   Patient presents with cc of cough and asthma. She has a hx of asthma and ongoing URI sxs for 1 month. She is out of her inhaler and has had t call EMS to the house several times for asthma attacks. There are 2 smokers in the house with her. She denies a personal hx of smoking. She has no fevers. She c/o of frequent asthma exacerbations. [place], but has difficulty getting that far and does not have a personal vehicle.  She just finished a course of oral prednisone. She dneies a hx of hospitalization or intubation.  Past Medical History  Diagnosis Date  . Depression   . Anxiety   . Mental retardation   . Bronchitis    History reviewed. No pertinent past surgical history. History reviewed. No pertinent family history. History  Substance Use Topics  . Smoking status: Current Every Day Smoker    Types: Cigarettes  . Smokeless tobacco: Never Used  . Alcohol Use: No   OB History    No data available     Review of Systems    Allergies  Sulfa drugs cross reactors  Home Medications   Prior to Admission medications   Medication Sig Start Date End Date Taking? Authorizing Provider  albuterol (PROVENTIL HFA;VENTOLIN HFA) 108 (90 BASE) MCG/ACT inhaler Inhale 1-2 puffs into the lungs every 4 (four) hours as needed for wheezing or shortness of breath. 08/25/14   Arthor CaptainAbigail Freya Zobrist, PA-C  beclomethasone (QVAR) 40 MCG/ACT inhaler Inhale 1 puff into the lungs 2 (two) times daily. 08/25/14   Arthor CaptainAbigail Jeraldean Wechter, PA-C  benzonatate (TESSALON) 100 MG capsule Take 1 capsule (100 mg total) by mouth every 8 (eight) hours. 07/12/14   Rolan BuccoMelanie Belfi, MD  citalopram (CELEXA) 10 MG tablet Take 10 mg by  mouth daily.      Historical Provider, MD  diphenhydrAMINE (BENADRYL) 25 MG tablet Take 25 mg by mouth every 6 (six) hours as needed. For congestion    Historical Provider, MD  doxycycline (VIBRAMYCIN) 100 MG capsule Take 1 capsule (100 mg total) by mouth 2 (two) times daily. One po bid x 7 days 07/12/14   Rolan BuccoMelanie Belfi, MD  ibuprofen (ADVIL,MOTRIN) 200 MG tablet Take 400 mg by mouth every 6 (six) hours as needed. For pain    Historical Provider, MD  LORazepam (ATIVAN) 1 MG tablet Take 1 mg by mouth 2 (two) times daily before a meal.      Historical Provider, MD  neomycin-bacitracin-polymyxin (NEOSPORIN) [phone] ointment Apply to wounds twice daily and bandage. 01/31/13   Carlisle BeersJohn L Molpus, MD  oxyCODONE-acetaminophen (PERCOCET) 5-325 MG per tablet Take 1 tablet by mouth every 6 (six) hours as needed for moderate pain. 04/23/14   Purvis SheffieldForrest Harrison, MD  predniSONE (DELTASONE) 20 MG tablet 3 tabs po daily x 3 days, then 2 tabs x 3 days, then 1.5 tabs x 3 days, then 1 tab x 3 days, then 0.5 tabs x 3 days 08/25/14   Arthor CaptainAbigail Wilson Dusenbery, PA-C   BP 96/58 mmHg  Pulse 88  Temp(Src) 98.1 F (36.7 C) (Oral)  Resp 16  Wt 100 lb (45.36 kg)  SpO2 100%  LMP 08/18/2014 Physical Exam  Constitutional: She is oriented  to person, place, and time. She appears well-developed and well-nourished. No distress.  HENT:  Head: Normocephalic and atraumatic.  Eyes: Conjunctivae are normal. No scleral icterus.  Neck: Normal range of motion.  Cardiovascular: Normal rate, regular rhythm and normal heart sounds.  Exam reveals no gallop and no friction rub.   No murmur heard. Pulmonary/Chest: Effort normal. No respiratory distress. She has wheezes (diffuse, mild expiratory wheeze).  Abdominal: Soft. Bowel sounds are normal. She exhibits no distension and no mass. There is no tenderness. There is no guarding.  Neurological: She is alert and oriented to person, place, and time.  Skin: Skin is warm and dry. She is not diaphoretic.   Nursing note and vitals reviewed.   ED Course  Procedures (including critical care time) Labs Review Labs Reviewed - No data to display  Imaging Review Dg Chest 2 View  08/25/2014   CLINICAL DATA:  Cough.  Wheezing.  Shortness of breath.  EXAM: CHEST  2 VIEW  COMPARISON:  07/12/2014.  10/21/2013.  FINDINGS: Heart size is normal. Mediastinal shadows are normal. The lungs are clear. Borderline hyperinflation. No bronchial thickening. No infiltrate, mass, effusion or collapse. Pulmonary vascularity is normal. No bony abnormality.  IMPRESSION: Normal chest, except for borderline hyperinflation.   Electronically Signed   By: Paulina FusiMark  Shogry M.D.   On: 08/25/2014 15:58     EKG Interpretation None      MDM   Final diagnoses:  Cough  Asthma, moderate persistent, with acute exacerbation  Exposure to second hand tobacco smoke    Patient O2 saturations maintained >90, no current signs of respiratory distress. Lung exam improved after nebulizer treatment. Prednisone given in the ED and pt will bd dc with 2 week taper.also given inhaled steroid and alds Pt states they are breathing at baseline. Pt has been instructed to continue using prescribed medications and to speak with PCP about today's exacerbation.      Arthor Captainbigail Hanna Aultman, PA-C 08/26/14 2109  Enid SkeensJoshua M Zavitz, MD 08/27/14 [phone]

## 2014-12-14 ENCOUNTER — Emergency Department (HOSPITAL_BASED_OUTPATIENT_CLINIC_OR_DEPARTMENT_OTHER)
Admission: EM | Admit: 2014-12-14 | Discharge: 2014-12-14 | Disposition: A | Discharge status: Home / Self Care | Payer: Medicaid Other | Attending: Emergency Medicine | Admitting: Emergency Medicine

## 2014-12-14 ENCOUNTER — Encounter (HOSPITAL_BASED_OUTPATIENT_CLINIC_OR_DEPARTMENT_OTHER): Payer: Self-pay | Admitting: *Deleted

## 2014-12-14 DIAGNOSIS — Z8709 Personal history of other diseases of the respiratory system: Secondary | ICD-10-CM | POA: Insufficient documentation

## 2014-12-14 DIAGNOSIS — H6501 Acute serous otitis media, right ear: Secondary | ICD-10-CM | POA: Diagnosis not present

## 2014-12-14 DIAGNOSIS — F419 Anxiety disorder, unspecified: Secondary | ICD-10-CM | POA: Insufficient documentation

## 2014-12-14 DIAGNOSIS — Z7951 Long term (current) use of inhaled steroids: Secondary | ICD-10-CM | POA: Insufficient documentation

## 2014-12-14 DIAGNOSIS — F79 Unspecified intellectual disabilities: Secondary | ICD-10-CM | POA: Diagnosis not present

## 2014-12-14 DIAGNOSIS — Z79899 Other long term (current) drug therapy: Secondary | ICD-10-CM | POA: Insufficient documentation

## 2014-12-14 DIAGNOSIS — F329 Major depressive disorder, single episode, unspecified: Secondary | ICD-10-CM | POA: Insufficient documentation

## 2014-12-14 DIAGNOSIS — H9201 Otalgia, right ear: Secondary | ICD-10-CM | POA: Diagnosis present

## 2014-12-14 DIAGNOSIS — Z720 Tobacco use: Secondary | ICD-10-CM | POA: Insufficient documentation

## 2014-12-14 MED ORDER — AMOXICILLIN 500 MG PO CAPS: 500.0000 mg | ORAL_CAPSULE | Freq: Three times a day (TID) | ORAL | Status: DC

## 2014-12-14 NOTE — ED Notes (Signed)
Pt c/o URi symptoms and right ear pain x 1 week

## 2014-12-14 NOTE — ED Notes (Signed)
PA at bedside.

## 2014-12-14 NOTE — ED Provider Notes (Signed)
CSN: [id number]     Arrival date & time 12/14/14  1739 History   First MD Initiated Contact with Patient 12/14/14 1832     Chief Complaint  Patient presents with  . URI     (Consider location/radiation/quality/duration/timing/severity/associated sxs/prior Treatment) Patient is a 29 y.o. female presenting with URI. The history is provided by the patient and medical records.  URI Presenting symptoms: congestion, ear pain and rhinorrhea     29 year old female with past medical history significant for mental retardation, depression, anxiety, presenting to the ED for nasal congestion and right ear pain for the past week. States symptoms have been progressively worsening. She is also been sneezing but thinks this is due to the pollen. She denies any cough, chest pain, or shortness of breath. No fever or chills. No known sick contacts.  Past Medical History  Diagnosis Date  . Depression   . Anxiety   . Mental retardation   . Bronchitis    History reviewed. No pertinent past surgical history. History reviewed. No pertinent family history. History  Substance Use Topics  . Smoking status: Current Every Day Smoker -- 0.50 packs/day    Types: Cigarettes  . Smokeless tobacco: Never Used  . Alcohol Use: No   OB History    No data available     Review of Systems  HENT: Positive for congestion, ear pain and rhinorrhea.   All other systems reviewed and are negative.     Allergies  Sulfa drugs cross reactors  Home Medications   Prior to Admission medications   Medication Sig Start Date End Date Taking? Authorizing Provider  albuterol (PROVENTIL HFA;VENTOLIN HFA) 108 (90 BASE) MCG/ACT inhaler Inhale 1-2 puffs into the lungs every 4 (four) hours as needed for wheezing or shortness of breath. 08/25/14   Arthor CaptainAbigail Harris, PA-C  beclomethasone (QVAR) 40 MCG/ACT inhaler Inhale 1 puff into the lungs 2 (two) times daily. 08/25/14   Arthor CaptainAbigail Harris, PA-C  benzonatate (TESSALON) 100 MG capsule  Take 1 capsule (100 mg total) by mouth every 8 (eight) hours. 07/12/14   Rolan BuccoMelanie Belfi, MD  citalopram (CELEXA) 10 MG tablet Take 10 mg by mouth daily.      Historical Provider, MD  diphenhydrAMINE (BENADRYL) 25 MG tablet Take 25 mg by mouth every 6 (six) hours as needed. For congestion    Historical Provider, MD  doxycycline (VIBRAMYCIN) 100 MG capsule Take 1 capsule (100 mg total) by mouth 2 (two) times daily. One po bid x 7 days 07/12/14   Rolan BuccoMelanie Belfi, MD  ibuprofen (ADVIL,MOTRIN) 200 MG tablet Take 400 mg by mouth every 6 (six) hours as needed. For pain    Historical Provider, MD  LORazepam (ATIVAN) 1 MG tablet Take 1 mg by mouth 2 (two) times daily before a meal.      Historical Provider, MD  neomycin-bacitracin-polymyxin (NEOSPORIN) [phone] ointment Apply to wounds twice daily and bandage. 01/31/13   John Molpus, MD  oxyCODONE-acetaminophen (PERCOCET) 5-325 MG per tablet Take 1 tablet by mouth every 6 (six) hours as needed for moderate pain. 04/23/14   Purvis SheffieldForrest Harrison, MD  predniSONE (DELTASONE) 20 MG tablet 3 tabs po daily x 3 days, then 2 tabs x 3 days, then 1.5 tabs x 3 days, then 1 tab x 3 days, then 0.5 tabs x 3 days 08/25/14   Arthor CaptainAbigail Harris, PA-C   BP 116/96 mmHg  Pulse 62  Temp(Src) 97.8 F (36.6 C) (Oral)  Resp 18  Ht 5' (1.524 m)  Wt 98 lb (44.453  kg)  BMI 19.14 kg/m2  SpO2 97%   Physical Exam  Constitutional: She is oriented to person, place, and time. She appears well-developed and well-nourished. No distress.  HENT:  Head: Normocephalic and atraumatic.  Right Ear: Hearing and external ear normal. Tympanic membrane is erythematous.  Left Ear: Hearing, tympanic membrane and ear canal normal.  Nose: Mucosal edema present.  Mouth/Throat: Uvula is midline, oropharynx is clear and moist and mucous membranes are normal. No oropharyngeal exudate, posterior oropharyngeal edema, posterior oropharyngeal erythema or tonsillar abscesses.  Right EAC and TM erythematous, no signs  of perforation; no mastoid tenderness; hearing WNL Left ear WNL  Eyes: Conjunctivae and EOM are normal. Pupils are equal, round, and reactive to light.  Neck: Normal range of motion. Neck supple.  Cardiovascular: Normal rate, regular rhythm and normal heart sounds.   Pulmonary/Chest: Effort normal and breath sounds normal. No respiratory distress. She has no wheezes.  Abdominal: Soft. Bowel sounds are normal. There is no tenderness. There is no guarding.  Musculoskeletal: Normal range of motion.  Neurological: She is alert and oriented to person, place, and time.  Skin: Skin is warm and dry. She is not diaphoretic.  Psychiatric: She has a normal mood and affect.  Nursing note and vitals reviewed.   ED Course  Procedures (including critical care time) Labs Review Labs Reviewed - No data to display  Imaging Review No results found.   EKG Interpretation None      MDM   Final diagnoses:  Right acute serous otitis media, recurrence not specified   29 year old female with nasal congestion and right ear pain. She has signs of otitis media on exam. Her lung sounds are clear and her vital signs are stable on room air. Will discharged home on amoxicillin.  Discussed plan with patient, he/she acknowledged understanding and agreed with plan of care.  Return precautions given for new or worsening symptoms.  Garlon Hatchet, PA-C 12/14/14 1859  Nelva Nay, MD 12/19/14 [phone]

## 2014-12-14 NOTE — Discharge Instructions (Signed)
Take the prescribed medication as directed. °Return to the ED for new or worsening symptoms. ° °

## 2015-02-16 ENCOUNTER — Telehealth: Payer: Self-pay | Admitting: *Deleted

## 2015-02-16 NOTE — Telephone Encounter (Signed)
Pt signed ROI received via mail from [employer]. Forwarded to [facility] to scan/email to medical records. JG//CMA  

## 2015-08-28 IMAGING — CR DG CHEST 2V
2 series · 2 of 2 positions shown · non-contrast
Comparison: 07/12/2014.  10/21/2013.

CLINICAL DATA: Cough.  Wheezing.  Shortness of breath.

EXAM:
CHEST  2 VIEW

[w chest pa]
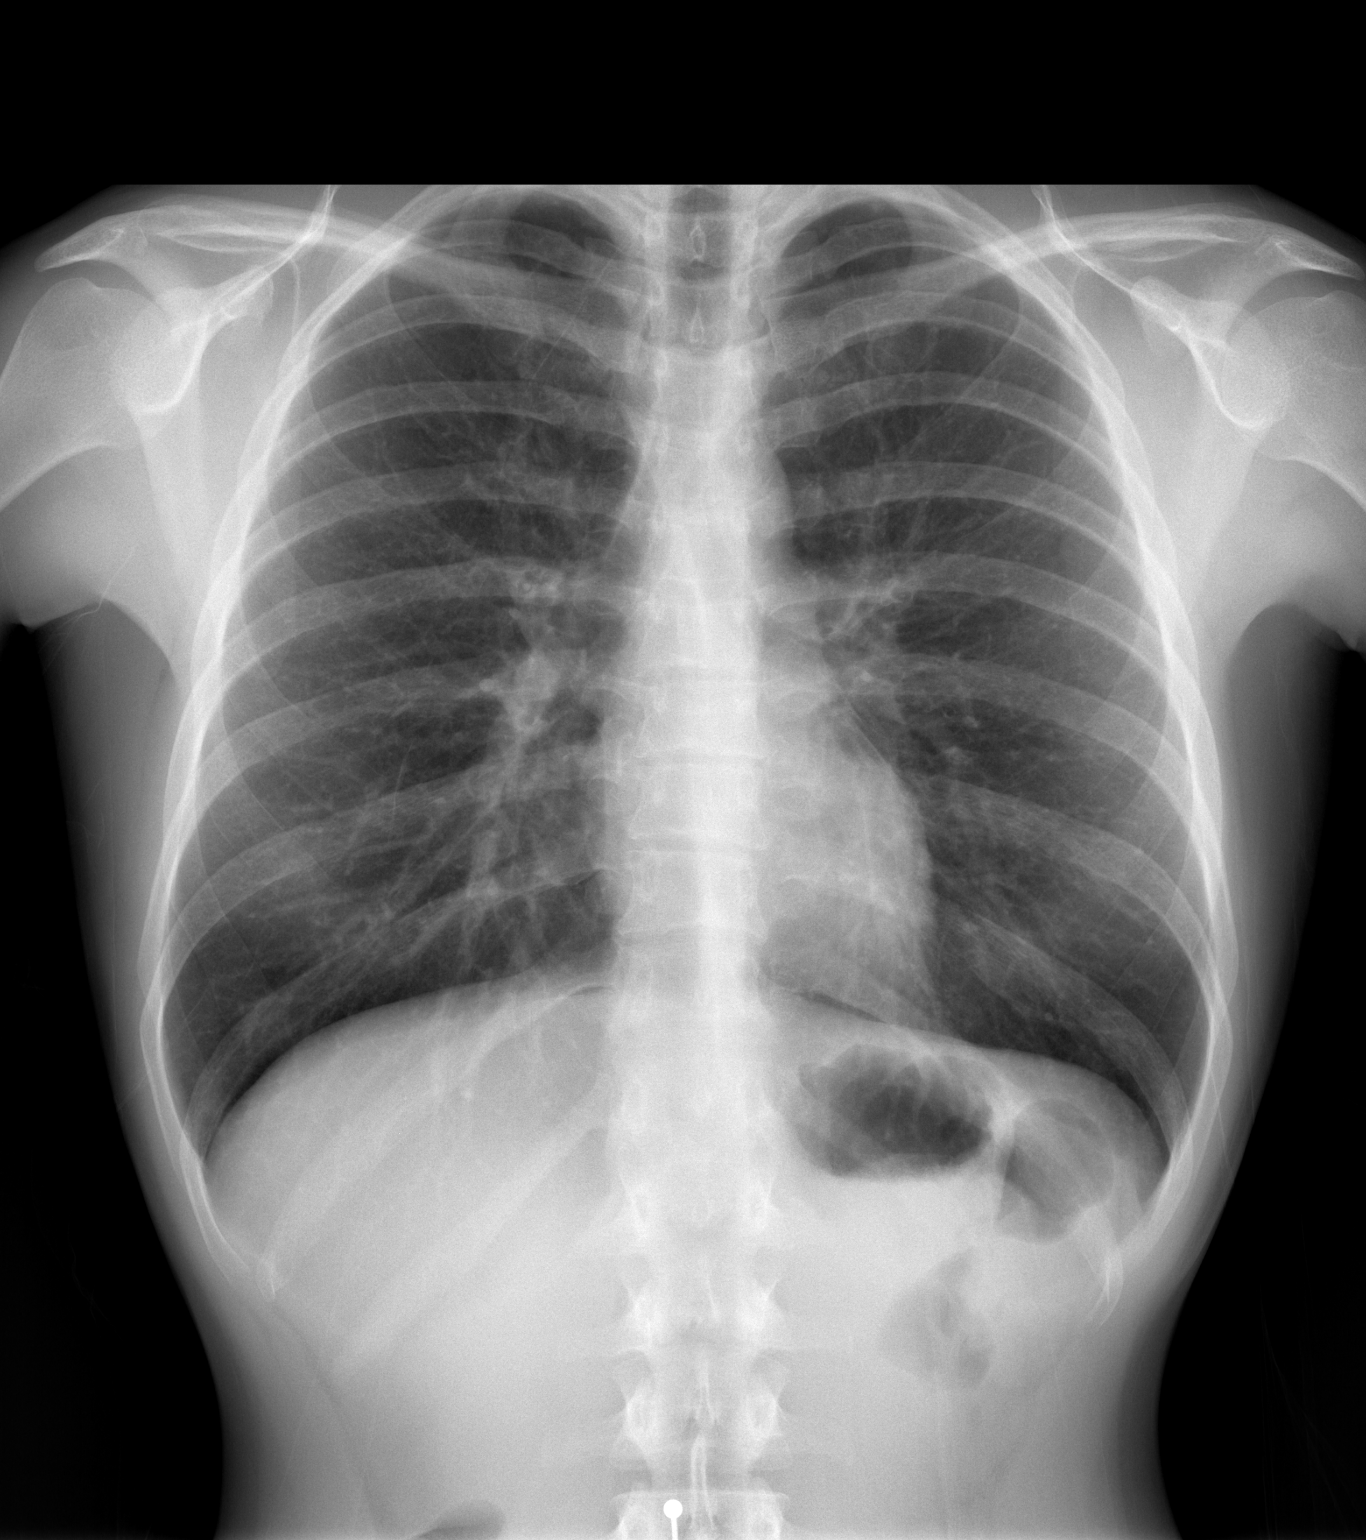

[w chest lat]
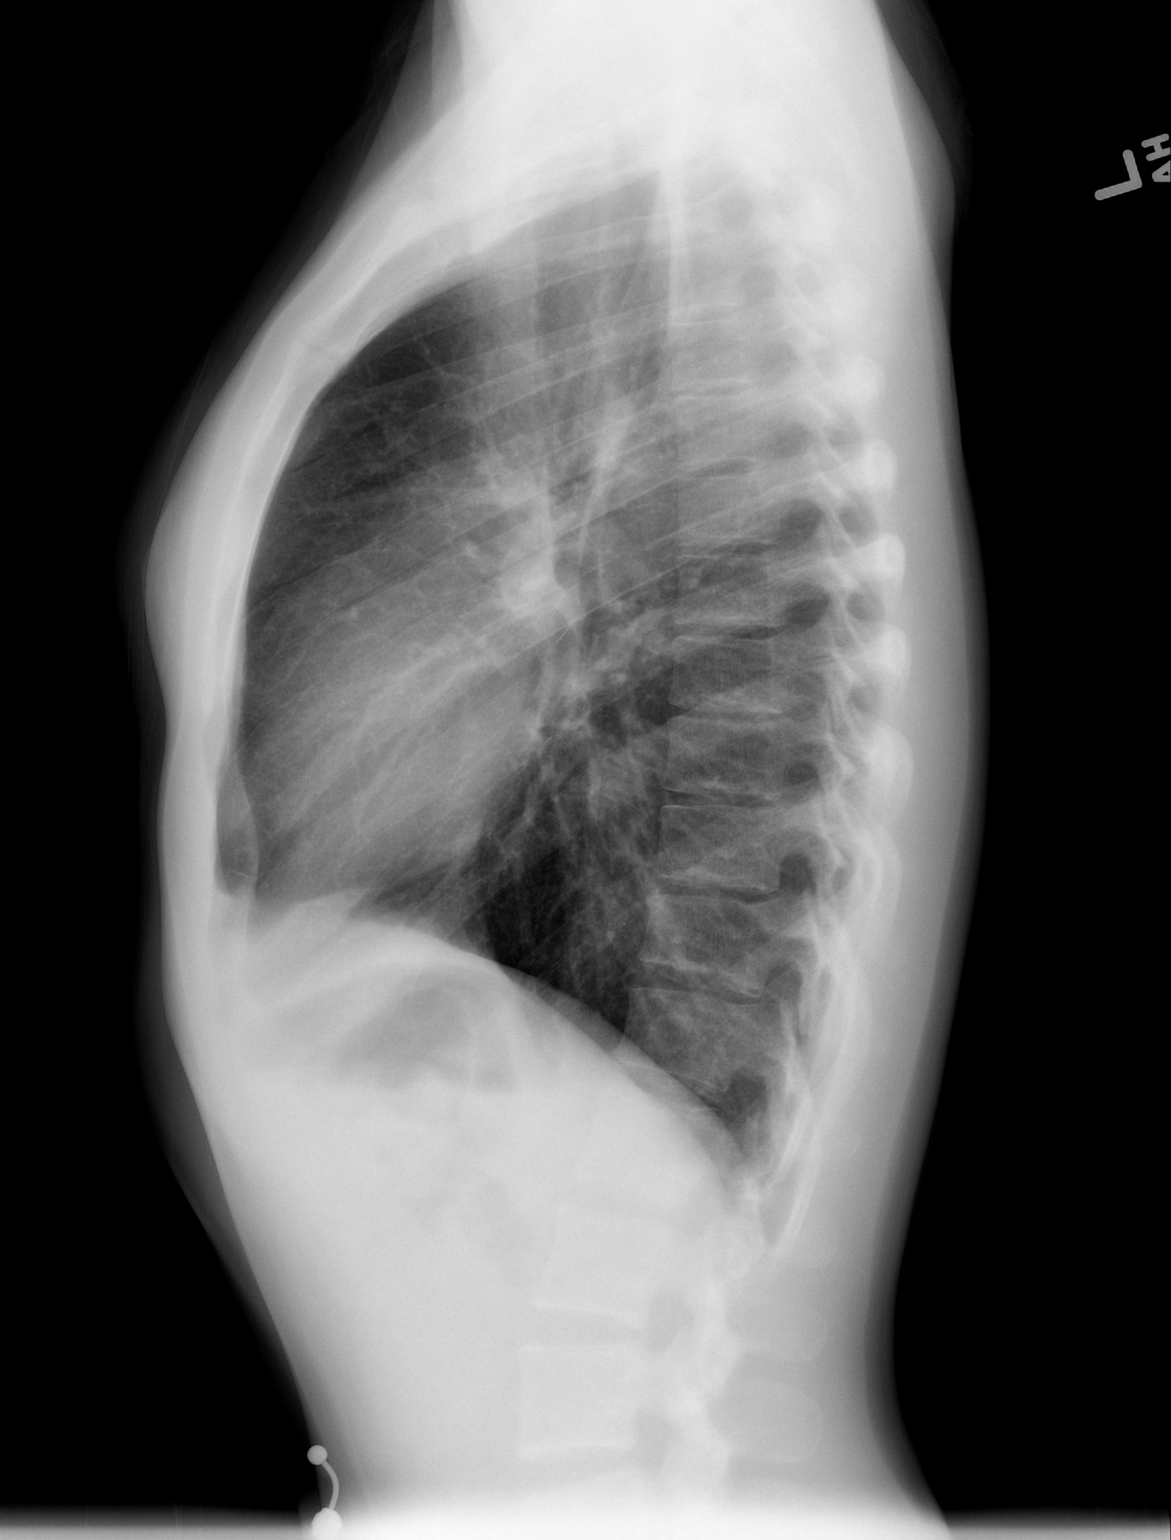

[2 of 2 positions shown; findings below may reference images not displayed]

FINDINGS: Heart size is normal. Mediastinal shadows are normal. The lungs are
clear. Borderline hyperinflation. No bronchial thickening. No
infiltrate, mass, effusion or collapse. Pulmonary vascularity is
normal. No bony abnormality.
IMPRESSION: Normal chest, except for borderline hyperinflation.

## 2017-02-27 ENCOUNTER — Emergency Department (HOSPITAL_BASED_OUTPATIENT_CLINIC_OR_DEPARTMENT_OTHER)
Admission: EM | Admit: 2017-02-27 | Discharge: 2017-02-27 | Disposition: A | Discharge status: Home / Self Care | Payer: Medicaid Other | Attending: Emergency Medicine | Admitting: Emergency Medicine

## 2017-02-27 ENCOUNTER — Encounter (HOSPITAL_BASED_OUTPATIENT_CLINIC_OR_DEPARTMENT_OTHER): Payer: Self-pay | Admitting: Emergency Medicine

## 2017-02-27 DIAGNOSIS — Y9389 Activity, other specified: Secondary | ICD-10-CM | POA: Diagnosis not present

## 2017-02-27 DIAGNOSIS — S0185XA Open bite of other part of head, initial encounter: Secondary | ICD-10-CM | POA: Insufficient documentation

## 2017-02-27 DIAGNOSIS — Y999 Unspecified external cause status: Secondary | ICD-10-CM | POA: Insufficient documentation

## 2017-02-27 DIAGNOSIS — Y929 Unspecified place or not applicable: Secondary | ICD-10-CM | POA: Diagnosis not present

## 2017-02-27 DIAGNOSIS — F17210 Nicotine dependence, cigarettes, uncomplicated: Secondary | ICD-10-CM | POA: Insufficient documentation

## 2017-02-27 DIAGNOSIS — Z79899 Other long term (current) drug therapy: Secondary | ICD-10-CM | POA: Diagnosis not present

## 2017-02-27 DIAGNOSIS — Z23 Encounter for immunization: Secondary | ICD-10-CM | POA: Insufficient documentation

## 2017-02-27 DIAGNOSIS — W540XXA Bitten by dog, initial encounter: Secondary | ICD-10-CM | POA: Insufficient documentation

## 2017-02-27 MED ORDER — AMOXICILLIN-POT CLAVULANATE 875-125 MG PO TABS
1.0000 | ORAL_TABLET | Freq: Two times a day (BID) | ORAL | 0 refills | Status: AC
Start: 2017-02-27 — End: 2017-03-06

## 2017-02-27 MED ORDER — TETANUS-DIPHTH-ACELL PERTUSSIS 5-2.5-18.5 LF-MCG/0.5 IM SUSP
0.5000 mL | Freq: Once | INTRAMUSCULAR | Status: AC
  Administered 2017-02-27: 0.5 mL via INTRAMUSCULAR
  Filled 2017-02-27: qty 0.5

## 2017-02-27 NOTE — ED Triage Notes (Signed)
Patient was bit by her puppy - the patient has left sided facial wound and right and left hand scratching and dog bites. No shots at this time due to the age of the puppies
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
## 2019-04-29 ENCOUNTER — Emergency Department (HOSPITAL_BASED_OUTPATIENT_CLINIC_OR_DEPARTMENT_OTHER)
Admission: EM | Admit: 2019-04-29 | Discharge: 2019-04-29 | Disposition: A | Discharge status: Home / Self Care | Payer: Medicaid Other | Attending: Emergency Medicine | Admitting: Emergency Medicine

## 2019-04-29 ENCOUNTER — Encounter (HOSPITAL_BASED_OUTPATIENT_CLINIC_OR_DEPARTMENT_OTHER): Payer: Self-pay | Admitting: *Deleted

## 2019-04-29 ENCOUNTER — Other Ambulatory Visit: Payer: Self-pay

## 2019-04-29 DIAGNOSIS — F17210 Nicotine dependence, cigarettes, uncomplicated: Secondary | ICD-10-CM | POA: Insufficient documentation

## 2019-04-29 DIAGNOSIS — K0889 Other specified disorders of teeth and supporting structures: Secondary | ICD-10-CM | POA: Diagnosis not present

## 2019-04-29 MED ORDER — PENICILLIN V POTASSIUM 500 MG PO TABS: 1000.0000 mg | ORAL_TABLET | Freq: Two times a day (BID) | ORAL | 0 refills | Status: DC

## 2019-04-29 NOTE — ED Triage Notes (Signed)
Pt c/o dental pain x 3 days.

## 2019-04-29 NOTE — Discharge Instructions (Signed)
Follow up with a dentist!  Return for worsening swelling, fever

## 2019-04-29 NOTE — ED Provider Notes (Signed)
[facility] EMERGENCY DEPARTMENT Provider Note   CSN: [id number] Arrival date & time: 04/29/19  1946     History   Chief Complaint Chief Complaint  Patient presents with  . Dental Pain    HPI Carmen Lambert is a 33 y.o. female.     33 yo F with a chief complaint of left lower dental pain.  Going on for 3 to 4 days.  Patient has had problems with her teeth in the past.  Has not seen a dentist in some time.  She is requesting antibiotics prior to seeing the dentist.  No fevers or chills.  No difficulty swallowing.  The history is provided by the patient and a relative.  Dental Pain Location:  Lower Lower teeth location:  18/LL 2nd molar Quality:  Aching Severity:  Mild Onset quality:  Gradual Duration:  3 days Timing:  Constant Progression:  Worsening Chronicity:  New Context: dental fracture   Previous work-up:  Dental exam, filled cavity and root canal Relieved by:  Nothing Worsened by:  Cold food/drink, hot food/drink, touching, jaw movement and pressure Ineffective treatments:  Topical anesthetic gel, NSAIDs and acetaminophen Associated symptoms: no congestion, no fever and no headaches     Past Medical History:  Diagnosis Date  . Anxiety   . Bronchitis   . Depression   . Mental retardation     There are no active problems to display for this patient.   History reviewed. No pertinent surgical history.   OB History   No obstetric history on file.      Home Medications    Prior to Admission medications   Medication Sig Start Date End Date Taking? Authorizing Provider  albuterol (PROVENTIL HFA;VENTOLIN HFA) 108 (90 BASE) MCG/ACT inhaler Inhale 1-2 puffs into the lungs every 4 (four) hours as needed for wheezing or shortness of breath. 08/25/14   Margarita Mail, PA-C  amoxicillin (AMOXIL) 500 MG capsule Take 1 capsule (500 mg total) by mouth 3 (three) times daily. 12/14/14   Larene Pickett, PA-C  beclomethasone (QVAR) 40 MCG/ACT inhaler Inhale  1 puff into the lungs 2 (two) times daily. 08/25/14   Harris, Abigail, PA-C  benzonatate (TESSALON) 100 MG capsule Take 1 capsule (100 mg total) by mouth every 8 (eight) hours. 07/12/14   Malvin Johns, MD  citalopram (CELEXA) 10 MG tablet Take 10 mg by mouth daily.      [provider]  diphenhydrAMINE (BENADRYL) 25 MG tablet Take 25 mg by mouth every 6 (six) hours as needed. For congestion    [provider]  doxycycline (VIBRAMYCIN) 100 MG capsule Take 1 capsule (100 mg total) by mouth 2 (two) times daily. One po bid x 7 days 07/12/14   Malvin Johns, MD  ibuprofen (ADVIL,MOTRIN) 200 MG tablet Take 400 mg by mouth every 6 (six) hours as needed. For pain    [provider]  LORazepam (ATIVAN) 1 MG tablet Take 1 mg by mouth 2 (two) times daily before a meal.      [provider]  neomycin-bacitracin-polymyxin (NEOSPORIN) [phone] ointment Apply to wounds twice daily and bandage. 01/31/13   Molpus, John, MD  oxyCODONE-acetaminophen (PERCOCET) 5-325 MG per tablet Take 1 tablet by mouth every 6 (six) hours as needed for moderate pain. 04/23/14   Pamella Pert, MD  penicillin v potassium (VEETID) 500 MG tablet Take 2 tablets (1,000 mg total) by mouth 2 (two) times daily. X 7 days 04/29/19   Deno Etienne, DO  predniSONE (DELTASONE)  20 MG tablet 3 tabs po daily x 3 days, then 2 tabs x 3 days, then 1.5 tabs x 3 days, then 1 tab x 3 days, then 0.5 tabs x 3 days 08/25/14   Arthor Captain, PA-C    Family History No family history on file.  Social History Social History   Tobacco Use  . Smoking status: Current Every Day Smoker    Packs/day: 0.50    Types: Cigarettes  . Smokeless tobacco: Never Used  Substance Use Topics  . Alcohol use: No  . Drug use: No     Allergies   Sulfa drugs cross reactors   Review of Systems Review of Systems  Constitutional: Negative for chills and fever.  HENT: Positive for dental problem. Negative for congestion and  rhinorrhea.   Eyes: Negative for redness and visual disturbance.  Respiratory: Negative for shortness of breath and wheezing.   Cardiovascular: Negative for chest pain and palpitations.  Gastrointestinal: Negative for nausea and vomiting.  Genitourinary: Negative for dysuria and urgency.  Musculoskeletal: Negative for arthralgias and myalgias.  Skin: Negative for pallor and wound.  Neurological: Negative for dizziness and headaches.     Physical Exam Updated Vital Signs BP 130/72 (BP Location: Right Arm)   Pulse 84   Temp 98.3 F (36.8 C) (Oral)   Resp 16   LMP 04/22/2019   SpO2 98%   Physical Exam Vitals signs and nursing note reviewed.  Constitutional:      General: She is not in acute distress.    Appearance: She is well-developed. She is not diaphoretic.  HENT:     Head: Normocephalic and atraumatic.     Comments: Fracture of the left lower second molar.  No surrounding erythema or fluctuance.  No sublingual swelling.  Tolerating secretions without difficulty. Eyes:     Pupils: Pupils are equal, round, and reactive to light.  Neck:     Musculoskeletal: Normal range of motion and neck supple.  Cardiovascular:     Rate and Rhythm: Normal rate and regular rhythm.     Heart sounds: No murmur. No friction rub. No gallop.   Pulmonary:     Effort: Pulmonary effort is normal.     Breath sounds: No wheezing or rales.  Abdominal:     General: There is no distension.     Palpations: Abdomen is soft.     Tenderness: There is no abdominal tenderness.  Musculoskeletal:        General: No tenderness.  Skin:    General: Skin is warm and dry.  Neurological:     Mental Status: She is alert and oriented to person, place, and time.  Psychiatric:        Behavior: Behavior normal.      ED Treatments / Results  Labs (all labs ordered are listed, but only abnormal results are displayed) Labs Reviewed - No data to display  EKG None  Radiology No results found.   Procedures Procedures (including critical care time)  Medications Ordered in ED Medications - No data to display   Initial Impression / Assessment and Plan / ED Course  I have reviewed the triage vital signs and the nursing notes.  Pertinent labs & imaging results that were available during my care of the patient were reviewed by me and considered in my medical decision making (see chart for details).        33 yo F with a chief complaints of left lower dental pain.  Going on for about  3 days.  No obvious signs of infection.  Will treat with antibiotics.  Dentistry follow-up.  9:58 PM:  I have discussed the diagnosis/risks/treatment options with the patient and friend and believe the pt to be eligible for discharge home to follow-up with Dentist. We also discussed returning to the ED immediately if new or worsening sx occur. We discussed the sx which are most concerning (e.g., sudden worsening pain, fever, inability to tolerate by mouth) that necessitate immediate return. Medications administered to the patient during their visit and any new prescriptions provided to the patient are listed below.  Medications given during this visit Medications - No data to display   The patient appears reasonably screen and/or stabilized for discharge and I doubt any other medical condition or other [facility] requiring further screening, evaluation, or treatment in the ED at this time prior to discharge.    Final Clinical Impressions(s) / ED Diagnoses   Final diagnoses:  Pain, dental    ED Discharge Orders         Ordered    penicillin v potassium (VEETID) 500 MG tablet  2 times daily     04/29/19 2151           Melene PlanFloyd, Archie Atilano, DO 04/29/19 2158

## 2019-10-30 ENCOUNTER — Encounter (HOSPITAL_BASED_OUTPATIENT_CLINIC_OR_DEPARTMENT_OTHER): Payer: Self-pay | Admitting: *Deleted

## 2019-10-30 ENCOUNTER — Emergency Department (HOSPITAL_BASED_OUTPATIENT_CLINIC_OR_DEPARTMENT_OTHER)
Admission: EM | Admit: 2019-10-30 | Discharge: 2019-10-30 | Disposition: A | Discharge status: Home / Self Care | Payer: Medicaid Other | Attending: Emergency Medicine | Admitting: Emergency Medicine

## 2019-10-30 ENCOUNTER — Other Ambulatory Visit: Payer: Self-pay

## 2019-10-30 DIAGNOSIS — F17210 Nicotine dependence, cigarettes, uncomplicated: Secondary | ICD-10-CM | POA: Insufficient documentation

## 2019-10-30 DIAGNOSIS — Z79899 Other long term (current) drug therapy: Secondary | ICD-10-CM | POA: Insufficient documentation

## 2019-10-30 DIAGNOSIS — K029 Dental caries, unspecified: Secondary | ICD-10-CM | POA: Insufficient documentation

## 2019-10-30 DIAGNOSIS — K0889 Other specified disorders of teeth and supporting structures: Secondary | ICD-10-CM | POA: Diagnosis not present

## 2019-10-30 MED ORDER — AMOXICILLIN 500 MG PO CAPS: 500.0000 mg | ORAL_CAPSULE | Freq: Three times a day (TID) | ORAL | 0 refills | Status: AC

## 2019-10-30 MED ORDER — HYDROCODONE-ACETAMINOPHEN 5-325 MG PO TABS
1.0000 | ORAL_TABLET | Freq: Once | ORAL | Status: AC
  Administered 2019-10-30: 1 via ORAL
  Filled 2019-10-30: qty 1

## 2019-10-30 MED FILL — AMOXICILLIN 500 MG CAPSULE: 500 | 7 days supply | Qty: 21 | Fill #0

## 2019-10-30 NOTE — ED Provider Notes (Signed)
[facility] EMERGENCY DEPARTMENT Provider Note   CSN: [id number] Arrival date & time: 10/30/19  1147     History Chief Complaint  Patient presents with  . Dental Pain    Carmen Lambert is a 34 y.o. female.  HPI 34 year old Caucasian female no pertinent past medical history presents to the emergency department today for evaluation of dental pain.  Patient reports for the past 2 days she been having swelling and pain to her right lower jaw.  Patient reports poor dentition.  Has not followed up with a dentist.  Denies any difficulties breathing or swallowing.  Has taken ibuprofen for pain with little relief.  Reports right-sided facial swelling.  No fevers or chills.  No aggravating or relieving factors.    Past Medical History:  Diagnosis Date  . Anxiety   . Bronchitis   . Depression   . Mental retardation     There are no problems to display for this patient.   History reviewed. No pertinent surgical history.   OB History   No obstetric history on file.     No family history on file.  Social History   Tobacco Use  . Smoking status: Current Every Day Smoker    Packs/day: 0.50    Types: Cigarettes  . Smokeless tobacco: Never Used  Substance Use Topics  . Alcohol use: No  . Drug use: No    Home Medications Prior to Admission medications   Medication Sig Start Date End Date Taking? Authorizing Provider  Buprenorphine HCl-Naloxone HCl (SUBOXONE SL) Place under the tongue.   Yes [provider]  albuterol (PROVENTIL HFA;VENTOLIN HFA) 108 (90 BASE) MCG/ACT inhaler Inhale 1-2 puffs into the lungs every 4 (four) hours as needed for wheezing or shortness of breath. 08/25/14   Margarita Mail, PA-C  amoxicillin (AMOXIL) 500 MG capsule Take 1 capsule (500 mg total) by mouth 3 (three) times daily. 12/14/14   Larene Pickett, PA-C  beclomethasone (QVAR) 40 MCG/ACT inhaler Inhale 1 puff into the lungs 2 (two) times daily. 08/25/14   Harris, Abigail, PA-C    benzonatate (TESSALON) 100 MG capsule Take 1 capsule (100 mg total) by mouth every 8 (eight) hours. 07/12/14   Malvin Johns, MD  citalopram (CELEXA) 10 MG tablet Take 10 mg by mouth daily.      [provider]  diphenhydrAMINE (BENADRYL) 25 MG tablet Take 25 mg by mouth every 6 (six) hours as needed. For congestion    [provider]  doxycycline (VIBRAMYCIN) 100 MG capsule Take 1 capsule (100 mg total) by mouth 2 (two) times daily. One po bid x 7 days 07/12/14   Malvin Johns, MD  ibuprofen (ADVIL,MOTRIN) 200 MG tablet Take 400 mg by mouth every 6 (six) hours as needed. For pain    [provider]  LORazepam (ATIVAN) 1 MG tablet Take 1 mg by mouth 2 (two) times daily before a meal.      [provider]  neomycin-bacitracin-polymyxin (NEOSPORIN) [phone] ointment Apply to wounds twice daily and bandage. 01/31/13   Molpus, John, MD  oxyCODONE-acetaminophen (PERCOCET) 5-325 MG per tablet Take 1 tablet by mouth every 6 (six) hours as needed for moderate pain. 04/23/14   Pamella Pert, MD  penicillin v potassium (VEETID) 500 MG tablet Take 2 tablets (1,000 mg total) by mouth 2 (two) times daily. X 7 days 04/29/19   Deno Etienne, DO  predniSONE (DELTASONE) 20 MG tablet 3 tabs po daily x 3 days, then 2 tabs x 3  days, then 1.5 tabs x 3 days, then 1 tab x 3 days, then 0.5 tabs x 3 days 08/25/14   Arthor Captain, PA-C    Allergies    Sulfa drugs cross reactors  Review of Systems   Review of Systems  Constitutional: Negative for chills and fever.  HENT: Positive for dental problem. Negative for congestion, sore throat and trouble swallowing.   Eyes: Negative for discharge.  Respiratory: Negative for choking.   Skin: Negative for color change.  Neurological: Negative for headaches.  Psychiatric/Behavioral: Negative for confusion.    Physical Exam Updated Vital Signs BP (!) 127/96   Pulse 83   Temp 98.3 F (36.8 C) (Oral)   Resp 20   Ht 5' (1.524 m)   Wt  46.3 kg   LMP 10/26/2019   SpO2 100%   BMI 19.92 kg/m   Physical Exam Vitals and nursing note reviewed.  Constitutional:      General: She is not in acute distress.    Appearance: She is well-developed.  HENT:     Head: Normocephalic and atraumatic.     Mouth/Throat:     Comments: Patient with very poor dentition throughout.  Several missing teeth.  Patient has several dental caries noted.  She does have some mild edema of the right lower gingiva.  Tender to palpation.  No gross abscess.  Mild right-sided facial swelling appreciated.  No erythema.  No sublingual or submandibular swelling.  Oropharynx is clear.  Uvula midline.  No muffled voice.  No trismus. Eyes:     General: No scleral icterus.       Right eye: No discharge.        Left eye: No discharge.  Pulmonary:     Effort: No respiratory distress.  Musculoskeletal:        General: Normal range of motion.     Cervical back: Normal range of motion. No rigidity.  Lymphadenopathy:     Cervical: No cervical adenopathy.  Skin:    Coloration: Skin is not pale.  Neurological:     Mental Status: She is alert.  Psychiatric:        Behavior: Behavior normal.        Thought Content: Thought content normal.        Judgment: Judgment normal.     ED Results / Procedures / Treatments   Labs (all labs ordered are listed, but only abnormal results are displayed) Labs Reviewed - No data to display  EKG None  Radiology No results found.  Procedures Procedures (including critical care time)  Medications Ordered in ED Medications - No data to display  ED Course  I have reviewed the triage vital signs and the nursing notes.  Pertinent labs & imaging results that were available during my care of the patient were reviewed by me and considered in my medical decision making (see chart for details).    MDM Rules/Calculators/A&P                      Patient with toothache.  No gross abscess.  Exam unconcerning for Ludwig's  angina or spread of infection.  Will treat with penicillin and pain medicine.  Urged patient to follow-up with dentist.    Final Clinical Impression(s) / ED Diagnoses Final diagnoses:  Pain, dental    Rx / DC Orders ED Discharge Orders         Ordered    amoxicillin (AMOXIL) 500 MG capsule  3 times daily  10/30/19 1209           Rise Mu, PA-C 10/30/19 1218    Little, Ambrose Finland, MD 10/30/19 1253

## 2019-10-30 NOTE — Discharge Instructions (Signed)

## 2019-10-30 NOTE — ED Triage Notes (Signed)
Dental pain. Swelling to the right side of her face since yesterday.

## 2020-02-07 ENCOUNTER — Other Ambulatory Visit: Payer: Self-pay

## 2020-02-07 ENCOUNTER — Emergency Department (HOSPITAL_BASED_OUTPATIENT_CLINIC_OR_DEPARTMENT_OTHER)
Admission: EM | Admit: 2020-02-07 | Discharge: 2020-02-08 | Discharge status: Against Medical Advice | Payer: Medicaid Other

## 2020-02-07 ENCOUNTER — Encounter (HOSPITAL_BASED_OUTPATIENT_CLINIC_OR_DEPARTMENT_OTHER): Payer: Self-pay | Admitting: Emergency Medicine

## 2020-02-08 ENCOUNTER — Encounter (HOSPITAL_BASED_OUTPATIENT_CLINIC_OR_DEPARTMENT_OTHER): Payer: Self-pay | Admitting: Emergency Medicine

## 2020-02-08 ENCOUNTER — Other Ambulatory Visit: Payer: Self-pay

## 2020-02-08 ENCOUNTER — Emergency Department (HOSPITAL_BASED_OUTPATIENT_CLINIC_OR_DEPARTMENT_OTHER)
Admission: EM | Admit: 2020-02-08 | Discharge: 2020-02-08 | Disposition: A | Discharge status: Home / Self Care | Payer: Medicaid Other | Attending: Emergency Medicine | Admitting: Emergency Medicine

## 2020-02-08 DIAGNOSIS — F17210 Nicotine dependence, cigarettes, uncomplicated: Secondary | ICD-10-CM | POA: Diagnosis not present

## 2020-02-08 DIAGNOSIS — K0889 Other specified disorders of teeth and supporting structures: Secondary | ICD-10-CM | POA: Insufficient documentation

## 2020-02-08 MED ORDER — PENICILLIN V POTASSIUM 500 MG PO TABS: 500.0000 mg | ORAL_TABLET | Freq: Four times a day (QID) | ORAL | 0 refills | Status: AC

## 2020-02-08 MED ORDER — IBUPROFEN 800 MG PO TABS: 800.0000 mg | ORAL_TABLET | Freq: Three times a day (TID) | ORAL | 0 refills | Status: AC | PRN

## 2020-02-08 NOTE — Discharge Instructions (Signed)
You were seen in the emergency department today with dental pain.  I am starting antibiotic in case there is infection at the base of the tooth that I cannot see.  Please take Tylenol and or Motrin as needed for pain.  Complete the course of antibiotics and return to the emergency department any new or suddenly worsening symptoms.

## 2020-02-08 NOTE — ED Provider Notes (Signed)
Emergency Department Provider Note   I have reviewed the triage vital signs and the nursing notes.   HISTORY  Chief Complaint Dental Pain   HPI Carmen Lambert is a 34 y.o. female presents to the ED with right upper dental pain with swelling. No fever. Pain with biting down in the area. No vision change or HA. No SOB. No throat tightness. Patient has a Pharmacist, community in [place] but is trying to find someone in [facility] No radiation of symptoms or modifying factors.    Past Medical History:  Diagnosis Date  . Anxiety   . Bronchitis   . Depression   . Mental retardation     There are no problems to display for this patient.   History reviewed. No pertinent surgical history.  Allergies Sulfa drugs cross reactors  No family history on file.  Social History Social History   Tobacco Use  . Smoking status: Current Every Day Smoker    Packs/day: 0.50    Types: Cigarettes  . Smokeless tobacco: Never Used  Substance Use Topics  . Alcohol use: No  . Drug use: No    Review of Systems  Constitutional: No fever/chills Eyes: No visual changes. ENT: No sore throat. Positive dental pain.  Respiratory: Denies shortness of breath. Neurological: Negative for headaches   ____________________________________________   PHYSICAL EXAM:  VITAL SIGNS: ED Triage Vitals  Enc Vitals Group     BP 02/08/20 2041 (!) 131/91     Pulse Rate 02/08/20 2041 85     Resp 02/08/20 2041 18     Temp 02/08/20 2041 98.5 F (36.9 C)     Temp Source 02/08/20 2041 Oral     SpO2 02/08/20 2041 98 %     Weight 02/08/20 2036 102 lb 1.2 oz (46.3 kg)     Height 02/08/20 2036 5\' 2"  (1.575 m)   Constitutional: Alert and oriented. Well appearing and in no acute distress. Eyes: Conjunctivae are normal.  Head: Atraumatic. Nose: No congestion/rhinnorhea. Mouth/Throat: Mucous membranes are moist.  Oropharynx non-erythematous. No visible abscess or trismus. Soft submandibular compartment.  Neck: No  stridor.  Cardiovascular: Normal rate, regular rhythm. Good peripheral circulation. Grossly normal heart sounds.   Respiratory: Normal respiratory effort.  Gastrointestinal: Soft and nontender. No distention.  Musculoskeletal: No gross deformities of extremities. Neurologic:  Normal speech and language.  Skin:  Skin is warm, dry and intact. No rash noted.   ____________________________________________   PROCEDURES  Procedure(s) performed:   Procedures  None ____________________________________________   INITIAL IMPRESSION / ASSESSMENT AND PLAN / ED COURSE  Pertinent labs & imaging results that were available during my care of the patient were reviewed by me and considered in my medical decision making (see chart for details).   Patient with dental pain and no visible, drainable abscess. Plan for abx and provided info for dentist on call. No evidence on exam to suspect Ludwig's or other dental emergency.    ____________________________________________  FINAL CLINICAL IMPRESSION(S) / ED DIAGNOSES  Final diagnoses:  Pain, dental     NEW OUTPATIENT MEDICATIONS STARTED DURING THIS VISIT:  Discharge Medication List as of 02/08/2020 10:08 PM    START taking these medications   Details  penicillin v potassium (VEETID) 500 MG tablet Take 1 tablet (500 mg total) by mouth 4 (four) times daily for 7 days., Starting Sun 02/08/2020, Until Sun 02/15/2020, Normal        Note:  This document was prepared using Dragon voice recognition software and may  include unintentional dictation errors.  Alona Bene, MD, [facility]    Penne Rosenstock, Arlyss Repress, MD 02/10/20 2013

## 2020-02-08 NOTE — ED Triage Notes (Signed)
Patient presents with complaints of right upper dental pain; onset 2-3 days ago; States took tylenol and ibuprofen 1 hour pta. nad noted.

## 2020-02-08 NOTE — ED Triage Notes (Signed)
Patient here yesterday for same; LWBS after triage.

## 2021-11-16 ENCOUNTER — Encounter (HOSPITAL_BASED_OUTPATIENT_CLINIC_OR_DEPARTMENT_OTHER): Payer: Self-pay

## 2021-11-16 ENCOUNTER — Emergency Department (HOSPITAL_BASED_OUTPATIENT_CLINIC_OR_DEPARTMENT_OTHER)
Admission: EM | Admit: 2021-11-16 | Discharge: 2021-11-16 | Disposition: A | Discharge status: Home / Self Care | Payer: Medicaid Other | Attending: Emergency Medicine | Admitting: Emergency Medicine

## 2021-11-16 ENCOUNTER — Other Ambulatory Visit: Payer: Self-pay

## 2021-11-16 DIAGNOSIS — W540XXA Bitten by dog, initial encounter: Secondary | ICD-10-CM | POA: Insufficient documentation

## 2021-11-16 DIAGNOSIS — S51811A Laceration without foreign body of right forearm, initial encounter: Secondary | ICD-10-CM | POA: Diagnosis not present

## 2021-11-16 DIAGNOSIS — S59911A Unspecified injury of right forearm, initial encounter: Secondary | ICD-10-CM | POA: Diagnosis present

## 2021-11-16 MED ORDER — LIDOCAINE-EPINEPHRINE (PF) 2 %-1:200000 IJ SOLN
10.0000 mL | Freq: Once | INTRAMUSCULAR | Status: AC
  Administered 2021-11-16: 10 mL via INTRADERMAL
  Filled 2021-11-16: qty 20

## 2021-11-16 MED ORDER — AMOXICILLIN-POT CLAVULANATE 875-125 MG PO TABS
1.0000 | ORAL_TABLET | Freq: Once | ORAL | Status: AC
  Administered 2021-11-16: 1 via ORAL
  Filled 2021-11-16: qty 1

## 2021-11-16 NOTE — ED Provider Notes (Signed)
[facility] EMERGENCY DEPARTMENT Provider Note   CSN: [id number] Arrival date & time: 11/16/21  1728     History  Chief Complaint  Patient presents with   Animal Bite    Carmen Lambert is a 36 y.o. female.  36 yo F with a chief complaint of a dog bite.  She tells me that her dogs got into a fight and she tried to break it up and her right forearm got bit.  She tells me that both of her dogs immunizations are up-to-date.  She has pain mostly around the areas that were bit.  Happened about an hour ago.  Last tetanus was last time she actually came to the ED for a dog bite which was about 5 years ago.   Animal Bite     Home Medications Prior to Admission medications   Medication Sig Start Date End Date Taking? Authorizing Provider  albuterol (PROVENTIL HFA;VENTOLIN HFA) 108 (90 BASE) MCG/ACT inhaler Inhale 1-2 puffs into the lungs every 4 (four) hours as needed for wheezing or shortness of breath. 08/25/14   Arthor Captain, PA-C  amoxicillin (AMOXIL) 500 MG capsule Take 1 capsule (500 mg total) by mouth 3 (three) times daily. 10/30/19   Rise Mu, PA-C  beclomethasone (QVAR) 40 MCG/ACT inhaler Inhale 1 puff into the lungs 2 (two) times daily. 08/25/14   Harris, Abigail, PA-C  benzonatate (TESSALON) 100 MG capsule Take 1 capsule (100 mg total) by mouth every 8 (eight) hours. 07/12/14   Rolan Bucco, MD  Buprenorphine HCl-Naloxone HCl (SUBOXONE SL) Place under the tongue.    [provider]  citalopram (CELEXA) 10 MG tablet Take 10 mg by mouth daily.      [provider]  diphenhydrAMINE (BENADRYL) 25 MG tablet Take 25 mg by mouth every 6 (six) hours as needed. For congestion    [provider]  ibuprofen (ADVIL) 800 MG tablet Take 1 tablet (800 mg total) by mouth every 8 (eight) hours as needed for moderate pain. 02/08/20   Long, Arlyss Repress, MD  LORazepam (ATIVAN) 1 MG tablet Take 1 mg by mouth 2 (two) times daily before a meal.       [provider]  neomycin-bacitracin-polymyxin (NEOSPORIN) [phone] ointment Apply to wounds twice daily and bandage. 01/31/13   Molpus, John, MD  oxyCODONE-acetaminophen (PERCOCET) 5-325 MG per tablet Take 1 tablet by mouth every 6 (six) hours as needed for moderate pain. 04/23/14   Purvis Sheffield, MD  predniSONE (DELTASONE) 20 MG tablet 3 tabs po daily x 3 days, then 2 tabs x 3 days, then 1.5 tabs x 3 days, then 1 tab x 3 days, then 0.5 tabs x 3 days 08/25/14   Arthor Captain, PA-C      Allergies    Sulfa drugs cross reactors    Review of Systems   Review of Systems  Physical Exam Updated Vital Signs BP 118/82 (BP Location: Left Arm)   Pulse (!) 102   Temp 98.5 F (36.9 C) (Oral)   Resp 18   Ht 5' (1.524 m)   Wt 57.2 kg   LMP 11/02/2021   SpO2 99%   BMI 24.61 kg/m  Physical Exam Vitals and nursing note reviewed.  Constitutional:      General: She is not in acute distress.    Appearance: She is well-developed. She is not diaphoretic.  HENT:     Head: Normocephalic and atraumatic.  Eyes:     Pupils: Pupils are equal, round, and reactive  to light.  Cardiovascular:     Rate and Rhythm: Normal rate and regular rhythm.     Heart sounds: No murmur heard.   No friction rub. No gallop.  Pulmonary:     Effort: Pulmonary effort is normal.     Breath sounds: No wheezing or rales.  Abdominal:     General: There is no distension.     Palpations: Abdomen is soft.     Tenderness: There is no abdominal tenderness.  Musculoskeletal:        General: No tenderness.     Cervical back: Normal range of motion and neck supple.     Comments: 2 puncture wounds to the dorsal aspect of the right forearm and a puncture wound on the volar aspects as well as the bit larger of a puncture wound proximally.  Skin:    General: Skin is warm and dry.  Neurological:     Mental Status: She is alert and oriented to person, place, and time.  Psychiatric:        Behavior: Behavior normal.     ED Results / Procedures / Treatments   Labs (all labs ordered are listed, but only abnormal results are displayed) Labs Reviewed - No data to display  EKG None  Radiology No results found.  Procedures .Marland KitchenLaceration Repair  Date/Time: 11/16/2021 6:29 PM Performed by: Melene Plan, DO Authorized by: Melene Plan, DO   Consent:    Consent obtained:  Verbal   Consent given by:  Patient   Risks, benefits, and alternatives were discussed: yes     Risks discussed:  Infection, pain, poor cosmetic result and poor wound healing   Alternatives discussed:  No treatment Universal protocol:    Procedure explained and questions answered to patient or proxy's satisfaction: yes     Immediately prior to procedure, a time out was called: yes     Patient identity confirmed:  Verbally with patient Anesthesia:    Anesthesia method:  Local infiltration   Local anesthetic:  Lidocaine 2% WITH epi Laceration details:    Location:  Shoulder/arm   Shoulder/arm location:  R lower arm   Length (cm):  5 Pre-procedure details:    Preparation:  Patient was prepped and draped in usual sterile fashion Exploration:    Limited defect created (wound extended): no     Hemostasis achieved with:  Epinephrine and direct pressure   Wound exploration: entire depth of wound visualized     Contaminated: no   Treatment:    Area cleansed with:  Chlorhexidine   Amount of cleaning:  Standard   Irrigation solution:  Sterile saline   Irrigation volume:  50   Irrigation method:  Pressure wash   Visualized foreign bodies/material removed: no     Debridement:  None   Undermining:  None   Scar revision: no   Skin repair:    Repair method:  Sutures   Suture size:  4-0   Suture material:  Nylon   Suture technique:  Simple interrupted   Number of sutures:  1 Approximation:    Approximation:  Loose Repair type:    Repair type:  Simple Post-procedure details:    Dressing:  Antibiotic ointment and bulky dressing    Procedure completion:  Tolerated well, no immediate complications    Medications Ordered in ED Medications  lidocaine-EPINEPHrine (XYLOCAINE W/EPI) 2 %-1:200000 (PF) injection 10 mL (10 mLs Intradermal Given by Other 11/16/21 1817)  amoxicillin-clavulanate (AUGMENTIN) 875-125 MG per tablet 1 tablet (1 tablet Oral  Given 11/16/21 1756)    ED Course/ Medical Decision Making/ A&P                           Medical Decision Making Risk Prescription drug management.   36 yo F with a chief complaint of a dog bite.  This happened while she was trying to break up a fight between her dogs at home.  The dogs are immunized.  She has multiple puncture wounds.  One is large enough that I feel it would benefit from loose closure.  Otherwise we will leave them open.  Oral antibiotics.  6:31 PM:  I have discussed the diagnosis/risks/treatment options with the patient.  Evaluation and diagnostic testing in the emergency department does not suggest an emergent condition requiring admission or immediate intervention beyond what has been performed at this time.  They will follow up with  PCP. We also discussed returning to the ED immediately if new or worsening sx occur. We discussed the sx which are most concerning (e.g., sudden worsening pain, fever, inability to tolerate by mouth) that necessitate immediate return. Medications administered to the patient during their visit and any new prescriptions provided to the patient are listed below.  Medications given during this visit Medications  lidocaine-EPINEPHrine (XYLOCAINE W/EPI) 2 %-1:200000 (PF) injection 10 mL (10 mLs Intradermal Given by Other 11/16/21 1817)  amoxicillin-clavulanate (AUGMENTIN) 875-125 MG per tablet 1 tablet (1 tablet Oral Given 11/16/21 1756)     The patient appears reasonably screen and/or stabilized for discharge and I doubt any other medical condition or other [facility] requiring further screening, evaluation, or treatment in the ED at this  time prior to discharge.            Final Clinical Impression(s) / ED Diagnoses Final diagnoses:  Dog bite, initial encounter    Rx / DC Orders ED Discharge Orders     None         Melene Plan, DO 11/16/21 1831

## 2021-11-16 NOTE — ED Triage Notes (Signed)
Pt reports her dog bit her right FA ~1hour PTA-multiple bites noted-NAD-steady gait ?

## 2021-11-16 NOTE — Discharge Instructions (Signed)
Return for redness drainage or if you get a fever.  The sutures that were used should be removed between day 7-10.  This can be done here at urgent care or at your family doctor's office..  The area can get wet but not fully immersed underwater.  No scrubbing.  If you really want to clean it you can apply a half-and-half hydrogen peroxide solution with water on a Q-tip.  You can apply an ointment a couple times a day this could be as simple as Vaseline but could also be an antibiotic ointment if you wish..  Once it is healed please try to avoid prolonged sun exposure use sunscreen.  Gells that have silicone antigens have been shown to reduce scarring and some research.   ? ?
